# Patient Record
Sex: Female | Born: 1965 | Race: White | Hispanic: No | Smoking: Never smoker
Health system: Southern US, Community
[De-identification: ages and names within clinical notes are randomized; demographics above are authoritative.]

## PROBLEM LIST (undated history)

## (undated) DIAGNOSIS — E7800 Pure hypercholesterolemia, unspecified: Secondary | ICD-10-CM

## (undated) DIAGNOSIS — E119 Type 2 diabetes mellitus without complications: Secondary | ICD-10-CM

## (undated) DIAGNOSIS — I10 Essential (primary) hypertension: Secondary | ICD-10-CM

## (undated) DIAGNOSIS — N182 Chronic kidney disease, stage 2 (mild): Secondary | ICD-10-CM

## (undated) DIAGNOSIS — E559 Vitamin D deficiency, unspecified: Secondary | ICD-10-CM

## (undated) DIAGNOSIS — F419 Anxiety disorder, unspecified: Secondary | ICD-10-CM

## (undated) HISTORY — DX: Pure hypercholesterolemia, unspecified: E78.00

## (undated) HISTORY — DX: Anxiety disorder, unspecified: F41.9

## (undated) HISTORY — DX: Type 2 diabetes mellitus without complications: E11.9

## (undated) HISTORY — DX: Vitamin D deficiency, unspecified: E55.9

## (undated) HISTORY — DX: Chronic kidney disease, stage 2 (mild): N18.2

## (undated) HISTORY — DX: Essential (primary) hypertension: I10

---

## 2004-08-26 ENCOUNTER — Ambulatory Visit: Payer: Self-pay | Admitting: General Practice

## 2004-09-07 ENCOUNTER — Ambulatory Visit: Payer: Self-pay | Admitting: General Practice

## 2005-03-31 ENCOUNTER — Ambulatory Visit: Payer: Self-pay

## 2005-09-07 ENCOUNTER — Ambulatory Visit: Payer: Self-pay | Admitting: General Practice

## 2006-10-04 ENCOUNTER — Ambulatory Visit: Payer: Self-pay | Admitting: Endocrinology

## 2007-11-02 ENCOUNTER — Ambulatory Visit: Payer: Self-pay

## 2008-11-04 ENCOUNTER — Ambulatory Visit: Payer: Self-pay

## 2009-11-10 ENCOUNTER — Ambulatory Visit: Payer: Self-pay

## 2010-03-15 HISTORY — PX: BREAST BIOPSY: SHX20

## 2010-11-24 ENCOUNTER — Ambulatory Visit: Payer: Self-pay

## 2010-11-26 ENCOUNTER — Ambulatory Visit: Payer: Self-pay

## 2010-12-31 ENCOUNTER — Ambulatory Visit: Payer: Self-pay | Admitting: General Surgery

## 2011-02-08 ENCOUNTER — Ambulatory Visit: Payer: Self-pay | Admitting: General Surgery

## 2011-04-22 ENCOUNTER — Ambulatory Visit: Payer: Self-pay | Admitting: General Surgery

## 2013-03-15 HISTORY — PX: COLONOSCOPY: SHX174

## 2013-12-11 ENCOUNTER — Ambulatory Visit: Payer: Self-pay

## 2014-07-12 ENCOUNTER — Ambulatory Visit
Admit: 2014-07-12 | Disposition: A | Discharge status: Home / Self Care | Payer: Self-pay | Attending: Gastroenterology | Admitting: Gastroenterology

## 2015-12-11 ENCOUNTER — Other Ambulatory Visit: Payer: Self-pay | Admitting: Obstetrics and Gynecology

## 2015-12-11 DIAGNOSIS — Z1231 Encounter for screening mammogram for malignant neoplasm of breast: Secondary | ICD-10-CM

## 2015-12-18 ENCOUNTER — Encounter: Payer: Self-pay | Admitting: Radiology

## 2015-12-18 ENCOUNTER — Ambulatory Visit
Admission: RE | Admit: 2015-12-18 | Discharge: 2015-12-18 | Disposition: A | Discharge status: Home / Self Care | Payer: BLUE CROSS/BLUE SHIELD | Source: Ambulatory Visit | Attending: Obstetrics and Gynecology | Admitting: Obstetrics and Gynecology

## 2015-12-18 DIAGNOSIS — Z1231 Encounter for screening mammogram for malignant neoplasm of breast: Secondary | ICD-10-CM | POA: Diagnosis not present

## 2015-12-24 ENCOUNTER — Inpatient Hospital Stay
Admission: RE | Admit: 2015-12-24 | Discharge: 2015-12-24 | Disposition: A | Discharge status: Home / Self Care | Payer: Self-pay | Source: Ambulatory Visit | Attending: *Deleted | Admitting: *Deleted

## 2015-12-24 ENCOUNTER — Other Ambulatory Visit: Payer: Self-pay | Admitting: *Deleted

## 2015-12-24 DIAGNOSIS — Z9289 Personal history of other medical treatment: Secondary | ICD-10-CM

## 2016-06-29 ENCOUNTER — Other Ambulatory Visit: Payer: Self-pay

## 2016-06-29 MED ORDER — LISINOPRIL 10 MG PO TABS: 10.0000 mg | ORAL_TABLET | Freq: Every day | ORAL | 0 refills | Status: DC

## 2016-06-29 MED ORDER — CITALOPRAM HYDROBROMIDE 40 MG PO TABS: 40.0000 mg | ORAL_TABLET | Freq: Every day | ORAL | 0 refills | Status: DC

## 2016-06-29 MED ORDER — ATORVASTATIN CALCIUM 20 MG PO TABS: 20.0000 mg | ORAL_TABLET | Freq: Every day | ORAL | 0 refills | Status: DC

## 2016-07-09 ENCOUNTER — Encounter: Payer: Self-pay | Admitting: Advanced Practice Midwife

## 2016-07-09 ENCOUNTER — Ambulatory Visit (INDEPENDENT_AMBULATORY_CARE_PROVIDER_SITE_OTHER): Payer: BLUE CROSS/BLUE SHIELD | Admitting: Advanced Practice Midwife

## 2016-07-09 VITALS — BP 130/80 | HR 81 | Ht 65.0 in | Wt 225.0 lb

## 2016-07-09 DIAGNOSIS — Z01419 Encounter for gynecological examination (general) (routine) without abnormal findings: Secondary | ICD-10-CM

## 2016-07-09 NOTE — Progress Notes (Signed)
Patient ID: Cloria Spring, female   DOB: Dec 12, 1965, 51 y.o.   MRN: [id number]     Gynecology Annual Exam  PCP: Althea Grimmer, PA-C  Chief Complaint:  Chief Complaint  Patient presents with  . Gynecologic Exam    History of Present Illness:Patient is a 51 y.o. G2P1102 presents for annual exam. The patient has no complaints today.   LMP: No LMP recorded. Patient is not currently having periods (Reason: Postmenopausal).   The patient is sexually active. She denies dyspareunia.  The patient does perform self breast exams.  There is no notable family history of breast or ovarian cancer in her family. The patient is adopted and does not know that history.  The patient wears seatbelts: yes.   The patient has regular exercise: yes.    The patient denies current symptoms of depression.     Review of Systems: Review of Systems  Constitutional: Negative.   HENT: Negative.   Eyes: Negative.   Respiratory: Negative.   Cardiovascular: Negative.   Gastrointestinal: Negative.   Genitourinary: Negative.   Musculoskeletal: Negative.   Skin: Negative.   Neurological: Negative.   Endo/Heme/Allergies: Negative.   Psychiatric/Behavioral: Negative.     Past Medical History:  Past Medical History:  Diagnosis Date  . Diabetes mellitus without complication (HCC)   . Hypercholesteremia   . Hypertension     Past Surgical History:  Past Surgical History:  Procedure Laterality Date  . BREAST BIOPSY Right 2012   2 bx/clips-neg  . CESAREAN SECTION      Gynecologic History:  No LMP recorded. Patient is not currently having periods (Reason: Perimenopausal). Last Pap: Results were: no abnormalities 1 year ago Last mammogram: normal 6 months ago Obstetric History: [id number]  Family History:  Family History  Problem Relation Age of Onset  . Adopted: Yes    Social History:  Social History   Social History  . Marital status: Legally Separated    Spouse name: N/A  .  Number of children: N/A  . Years of education: N/A   Occupational History  . Not on file.   Social History Main Topics  . Smoking status: Never Smoker  . Smokeless tobacco: Never Used  . Alcohol use No  . Drug use: No  . Sexual activity: No   Other Topics Concern  . Not on file   Social History Narrative  . No narrative on file    Allergies:  No Known Allergies  Medications: Prior to Admission medications   Medication Sig Start Date End Date Taking? Authorizing Provider  atorvastatin (LIPITOR) 20 MG tablet Take 1 tablet (20 mg total) by mouth daily. 06/29/16 07/29/16 Yes Alicia B Copland, PA-C  citalopram (CELEXA) 40 MG tablet Take 1 tablet (40 mg total) by mouth daily. 06/29/16 07/29/16 Yes Alicia B Copland, PA-C  lisinopril (PRINIVIL,ZESTRIL) 10 MG tablet Take 1 tablet (10 mg total) by mouth daily. 06/29/16 07/29/16 Yes Alicia B Copland, PA-C  metFORMIN (GLUCOPHAGE) 500 MG tablet Take by mouth.   Yes Historical Provider, MD  polyethylene glycol powder (GLYCOLAX/MIRALAX) powder Take as directed for colonoscopy prep. 05/30/14   Historical Provider, MD    Physical Exam Vitals: Blood pressure 130/80, pulse 81, height  (1.651 m), weight 225 lb (102.1 kg).  General: NAD HEENT: normocephalic, anicteric Thyroid: no enlargement, no palpable nodules Pulmonary: No increased work of breathing, CTAB Cardiovascular: RRR, distal pulses 2+ Breast: Breast symmetrical, no tenderness, no palpable nodules or masses, no skin or nipple retraction present, no  nipple discharge.  No axillary or supraclavicular lymphadenopathy. Abdomen: NABS, soft, non-tender, non-distended.  Umbilicus without lesions.  No hepatomegaly, splenomegaly or masses palpable. No evidence of hernia  Genitourinary:  External: Normal external female genitalia.  Normal urethral meatus, normal  Bartholin's and Skene's glands.    Vagina: Normal vaginal mucosa, no evidence of prolapse.    Cervix: Grossly normal in appearance,  no bleeding, no CMT  Uterus: Non-enlarged, mobile, normal contour.    Adnexa: ovaries non-enlarged, no adnexal masses  Rectal: deferred  Lymphatic: no evidence of inguinal lymphadenopathy Extremities: no edema, erythema, or tenderness Neurologic: Grossly intact Psychiatric: mood appropriate, affect full    Assessment: 51 y.o. [id number] Well woman exam  Plan: Problem List Items Addressed This Visit    None    Visit Diagnoses    Well woman exam with routine gynecological exam    -  Primary      1) Mammogram - recommend yearly screening mammogram.  Mammogram Is up to date  2) Continue healthy lifestyle diet and exercise  3) ASCCP guidelines and rational discussed.  Patient opts for every 3 years screening interval  4) Osteoporosis  - per USPTF routine screening DEXA at age 82  5) Routine healthcare maintenance including cholesterol, diabetes screening discussed: declines blood work today  6) Colonoscopy: UTD 2 years ago normal  7) Rx's refilled through mail order [facility]  8) Follow up 1 year for routine annual   Tresea Mall, [place]

## 2016-07-12 ENCOUNTER — Telehealth: Payer: Self-pay

## 2016-07-12 NOTE — Telephone Encounter (Signed)
Pharmacy calling to verify rx's rcvd from [facility] for Metformin & Lisinopril. They just need to validate if this was for Kathy Burnett DOB [birth year]. Pharmacy states it's not on the rx. [phone]

## 2016-07-13 NOTE — Telephone Encounter (Signed)
Spoke w/pharmacy. Verified pt was seen by [facility] on 07/09/16 & per visit note, Rx's were sent to [facility] mail order. Pharmacy states they only identified pt by her insurance ID#. Pt Name & DOB was left off of pharmacy form.

## 2016-07-14 NOTE — Telephone Encounter (Signed)
Jane completed prescription  form for pt.

## 2016-11-01 ENCOUNTER — Telehealth: Payer: Self-pay

## 2016-11-01 NOTE — Telephone Encounter (Signed)
Pt found knot on breast bone between breasts.  Work Engineer, civil (consulting) adv her to be seen.  Adv I agreed.  Appt 11:10 in am c ABC.  Pt due for mammogram in Oct.

## 2016-11-02 ENCOUNTER — Ambulatory Visit (INDEPENDENT_AMBULATORY_CARE_PROVIDER_SITE_OTHER): Payer: BLUE CROSS/BLUE SHIELD | Admitting: Obstetrics and Gynecology

## 2016-11-02 VITALS — BP 120/74 | Ht 65.0 in | Wt 198.0 lb

## 2016-11-02 DIAGNOSIS — M899 Disorder of bone, unspecified: Secondary | ICD-10-CM

## 2016-11-02 DIAGNOSIS — M898X8 Other specified disorders of bone, other site: Secondary | ICD-10-CM

## 2016-11-02 NOTE — Progress Notes (Signed)
   Chief Complaint  Patient presents with  . Breast Mass    located at sternum    HPI:      Kathy Burnett is a 51 y.o. [id number] who LMP was No LMP recorded. Patient is perimenopausal., presents today for mass on sternum for the past 1 1/2 - 2 months. Pt just noticed it one day. No pain, non-tender, no change in size since first noticed. No breast masses. Last mammo 12/18/15 was neg. Pt is adopted so FH unknown.   Past Medical History:  Diagnosis Date  . Diabetes mellitus without complication (HCC)   . Hypercholesteremia   . Hypertension     Past Surgical History:  Procedure Laterality Date  . BREAST BIOPSY Right 2012   2 bx/clips-neg  . CESAREAN SECTION      Family History  Problem Relation Age of Onset  . Adopted: Yes    Social History   Social History  . Marital status: Legally Separated    Spouse name: N/A  . Number of children: N/A  . Years of education: N/A   Occupational History  . Not on file.   Social History Main Topics  . Smoking status: Never Smoker  . Smokeless tobacco: Never Used  . Alcohol use No  . Drug use: No  . Sexual activity: No   Other Topics Concern  . Not on file   Social History Narrative  . No narrative on file     Current Outpatient Prescriptions:  .  metFORMIN (GLUCOPHAGE) 500 MG tablet, Take by mouth., Disp: , Rfl:  .  atorvastatin (LIPITOR) 20 MG tablet, Take 1 tablet (20 mg total) by mouth daily., Disp: 30 tablet, Rfl: 0 .  citalopram (CELEXA) 40 MG tablet, Take 1 tablet (40 mg total) by mouth daily., Disp: 30 tablet, Rfl: 0 .  lisinopril (PRINIVIL,ZESTRIL) 10 MG tablet, Take 1 tablet (10 mg total) by mouth daily., Disp: 30 tablet, Rfl: 0 .  polyethylene glycol powder (GLYCOLAX/MIRALAX) powder, Take as directed for colonoscopy prep., Disp: , Rfl:    ROS:  Review of Systems  Constitutional: Negative for fatigue and fever.  Respiratory: Negative for cough, shortness of breath and wheezing.   Cardiovascular:  Negative for chest pain and palpitations.  Gastrointestinal: Negative for constipation, diarrhea and nausea.  Musculoskeletal: Negative for arthralgias, joint swelling and myalgias.     OBJECTIVE:   Vitals:  BP 120/74   Ht 5\' 5"  (1.651 m)   Wt 198 lb (89.8 kg)   BMI 32.95 kg/m   Physical Exam  Constitutional: She is well-developed, well-nourished, and in no distress.  Pulmonary/Chest: She exhibits mass and deformity. She exhibits no tenderness and no bony tenderness.    Vitals reviewed.    Assessment/Plan: Sternal mass - Chck u/s. Will f/u with results and go from there. - Plan: US Abdomen Limited  Spoke with Jennette Banker at [facility] imaging who suggested u/s first as opposed to xray.   Return if symptoms worsen or fail to improve.  Davyon Fisch B. Willman Cuny, PA-C 11/02/2016 11:35 AM

## 2016-11-05 ENCOUNTER — Ambulatory Visit
Admission: RE | Admit: 2016-11-05 | Discharge: 2016-11-05 | Disposition: A | Discharge status: Home / Self Care | Payer: BLUE CROSS/BLUE SHIELD | Source: Ambulatory Visit | Attending: Obstetrics and Gynecology | Admitting: Obstetrics and Gynecology

## 2016-11-05 DIAGNOSIS — M898X8 Other specified disorders of bone, other site: Secondary | ICD-10-CM

## 2016-11-05 DIAGNOSIS — M899 Disorder of bone, unspecified: Secondary | ICD-10-CM | POA: Diagnosis not present

## 2016-12-13 ENCOUNTER — Other Ambulatory Visit: Payer: Self-pay | Admitting: Obstetrics and Gynecology

## 2016-12-13 DIAGNOSIS — Z1231 Encounter for screening mammogram for malignant neoplasm of breast: Secondary | ICD-10-CM

## 2017-01-05 ENCOUNTER — Encounter: Payer: Self-pay | Admitting: Obstetrics and Gynecology

## 2017-01-05 ENCOUNTER — Encounter: Payer: Self-pay | Admitting: Radiology

## 2017-01-05 ENCOUNTER — Ambulatory Visit
Admission: RE | Admit: 2017-01-05 | Discharge: 2017-01-05 | Disposition: A | Discharge status: Home / Self Care | Payer: BLUE CROSS/BLUE SHIELD | Source: Ambulatory Visit | Attending: Obstetrics and Gynecology | Admitting: Obstetrics and Gynecology

## 2017-01-05 DIAGNOSIS — Z1231 Encounter for screening mammogram for malignant neoplasm of breast: Secondary | ICD-10-CM | POA: Diagnosis present

## 2017-07-11 ENCOUNTER — Ambulatory Visit (INDEPENDENT_AMBULATORY_CARE_PROVIDER_SITE_OTHER): Payer: BLUE CROSS/BLUE SHIELD | Admitting: Obstetrics and Gynecology

## 2017-07-11 ENCOUNTER — Encounter: Payer: Self-pay | Admitting: Obstetrics and Gynecology

## 2017-07-11 VITALS — BP 128/78 | HR 75 | Ht 65.0 in | Wt 165.0 lb

## 2017-07-11 DIAGNOSIS — E119 Type 2 diabetes mellitus without complications: Secondary | ICD-10-CM | POA: Insufficient documentation

## 2017-07-11 DIAGNOSIS — Z1321 Encounter for screening for nutritional disorder: Secondary | ICD-10-CM | POA: Diagnosis not present

## 2017-07-11 DIAGNOSIS — F419 Anxiety disorder, unspecified: Secondary | ICD-10-CM | POA: Diagnosis not present

## 2017-07-11 DIAGNOSIS — Z13220 Encounter for screening for lipoid disorders: Secondary | ICD-10-CM | POA: Diagnosis not present

## 2017-07-11 DIAGNOSIS — Z0000 Encounter for general adult medical examination without abnormal findings: Secondary | ICD-10-CM

## 2017-07-11 DIAGNOSIS — I10 Essential (primary) hypertension: Secondary | ICD-10-CM | POA: Diagnosis not present

## 2017-07-11 DIAGNOSIS — Z1231 Encounter for screening mammogram for malignant neoplasm of breast: Secondary | ICD-10-CM

## 2017-07-11 DIAGNOSIS — Z124 Encounter for screening for malignant neoplasm of cervix: Secondary | ICD-10-CM | POA: Diagnosis not present

## 2017-07-11 DIAGNOSIS — Z131 Encounter for screening for diabetes mellitus: Secondary | ICD-10-CM

## 2017-07-11 DIAGNOSIS — N182 Chronic kidney disease, stage 2 (mild): Secondary | ICD-10-CM | POA: Insufficient documentation

## 2017-07-11 DIAGNOSIS — Z01419 Encounter for gynecological examination (general) (routine) without abnormal findings: Secondary | ICD-10-CM

## 2017-07-11 DIAGNOSIS — Z1239 Encounter for other screening for malignant neoplasm of breast: Secondary | ICD-10-CM

## 2017-07-11 DIAGNOSIS — Z1151 Encounter for screening for human papillomavirus (HPV): Secondary | ICD-10-CM | POA: Diagnosis not present

## 2017-07-11 DIAGNOSIS — E7800 Pure hypercholesterolemia, unspecified: Secondary | ICD-10-CM | POA: Diagnosis not present

## 2017-07-11 MED ORDER — CITALOPRAM HYDROBROMIDE 40 MG PO TABS: 40.0000 mg | ORAL_TABLET | Freq: Every day | ORAL | 3 refills | Status: DC

## 2017-07-11 NOTE — Patient Instructions (Signed)
I value your feedback and entrusting us with your care. If you get a [facility] patient survey, I would appreciate you taking the time to let us know about your experience today. Thank you! 

## 2017-07-11 NOTE — Progress Notes (Signed)
PCP: Rica Records, PA-C   Chief Complaint  Patient presents with  . Gynecologic Exam    HPI:      [name] is a 52 y.o. [id number] who LMP was No LMP recorded. Patient is postmenopausal., presents today for her annual examination.  Her menses are absent for over a year due to menopause.  She does not have intermenstrual bleeding. She does not have vasomotor sx.   Sex activity: single partner, contraception - post menopausal status. She does not have vaginal dryness.  Last Pap: May 02, 2014  Results were: no abnormalities /neg HPV DNA.  Hx of STDs: none  Last mammogram: January 05, 2017  Results were: normal--routine follow-up in 12 months Pt is adopted, FH unknown. The patient does do self-breast exams. No change in enlarged xiphoid process since 8/18 u/s; most likely more prominent since wt loss.  Colonoscopy: colonoscopy 3 years ago without abnormalities.  Repeat due after 10 years.   Tobacco use: The patient denies current or previous tobacco use. Alcohol use: none Exercise: moderately active  She does get adequate calcium but not Vitamin D in her diet.  Pt takes celexa 40 mg daily for anxiety with sx control. No side effects. Wants to cont meds.   Pt with hx of hyperlipidemia, type 2 DM, stage 2 kidney disease, and HTN and is on lipitor 20 mg, metformin 500 mg, and lisinopril 10 mg. Pt didn't have labs last yr. Has lost 70# this yr with keto diet/exercise changes. She has not had recent labs since wt loss. Due today.   Past Medical History:  Diagnosis Date  . Anxiety   . Diabetes mellitus without complication (HCC)   . Hypercholesteremia   . Hypertension   . Stage 2 chronic kidney disease   . Vitamin D deficiency     Past Surgical History:  Procedure Laterality Date  . BREAST BIOPSY Right 2012   2 bx/clips-neg  . CESAREAN SECTION    . COLONOSCOPY  2015    Family History  Adopted: Yes    Social History   Socioeconomic History    . Marital status: Legally Separated    Spouse name: Not on file  . Number of children: Not on file  . Years of education: Not on file  . Highest education level: Not on file  Occupational History  . Not on file  Social Needs  . Financial resource strain: Not on file  . Food insecurity:    Worry: Not on file    Inability: Not on file  . Transportation needs:    Medical: Not on file    Non-medical: Not on file  Tobacco Use  . Smoking status: Never Smoker  . Smokeless tobacco: Never Used  Substance and Sexual Activity  . Alcohol use: No  . Drug use: No  . Sexual activity: Yes    Birth control/protection: Post-menopausal  Lifestyle  . Physical activity:    Days per week: Not on file    Minutes per session: Not on file  . Stress: Not on file  Relationships  . Social connections:    Talks on phone: Not on file    Gets together: Not on file    Attends religious service: Not on file    Active member of club or organization: Not on file    Attends meetings of clubs or organizations: Not on file    Relationship status: Not on file  . Intimate partner violence:  Fear of current or ex partner: Not on file    Emotionally abused: Not on file    Physically abused: Not on file    Forced sexual activity: Not on file  Other Topics Concern  . Not on file  Social History Narrative  . Not on file    Outpatient Medications Prior to Visit  Medication Sig Dispense Refill  . metFORMIN (GLUCOPHAGE) 500 MG tablet Take by mouth.    Marland Kitchen atorvastatin (LIPITOR) 20 MG tablet Take 1 tablet (20 mg total) by mouth daily. 30 tablet 0  . lisinopril (PRINIVIL,ZESTRIL) 10 MG tablet Take 1 tablet (10 mg total) by mouth daily. 30 tablet 0  . polyethylene glycol powder (GLYCOLAX/MIRALAX) powder Take as directed for colonoscopy prep.    . citalopram (CELEXA) 40 MG tablet Take 1 tablet (40 mg total) by mouth daily. 30 tablet 0   No facility-administered medications prior to visit.         ROS:  Review of Systems  Constitutional: Negative for fatigue, fever and unexpected weight change.  Respiratory: Negative for cough, shortness of breath and wheezing.   Cardiovascular: Negative for chest pain, palpitations and leg swelling.  Gastrointestinal: Negative for blood in stool, constipation, diarrhea, nausea and vomiting.  Endocrine: Negative for cold intolerance, heat intolerance and polyuria.  Genitourinary: Negative for dyspareunia, dysuria, flank pain, frequency, genital sores, hematuria, menstrual problem, pelvic pain, urgency, vaginal bleeding, vaginal discharge and vaginal pain.  Musculoskeletal: Negative for back pain, joint swelling and myalgias.  Skin: Negative for rash.  Neurological: Negative for dizziness, syncope, light-headedness, numbness and headaches.  Hematological: Negative for adenopathy.  Psychiatric/Behavioral: Negative for agitation, confusion, sleep disturbance and suicidal ideas. The patient is not nervous/anxious.    BREAST: No symptoms    Objective: BP 128/78   Pulse 75   Ht  (1.651 m)   Wt 165 lb (74.8 kg)   BMI 27.46 kg/m    Physical Exam  Constitutional: She is oriented to person, place, and time. She appears well-developed and well-nourished.  Genitourinary: Vagina normal and uterus normal. There is no rash or tenderness on the right labia. There is no rash or tenderness on the left labia. No erythema or tenderness in the vagina. No vaginal discharge found. Right adnexum does not display mass and does not display tenderness. Left adnexum does not display mass and does not display tenderness. Cervix does not exhibit motion tenderness or polyp. Uterus is not enlarged or tender.  Neck: Normal range of motion. No thyromegaly present.  Cardiovascular: Normal rate, regular rhythm and normal heart sounds.  No murmur heard. Pulmonary/Chest: Effort normal and breath sounds normal. Right breast exhibits no mass, no nipple discharge,  no skin change and no tenderness. Left breast exhibits no mass, no nipple discharge, no skin change and no tenderness.  Abdominal: Soft. There is no tenderness. There is no guarding.  Musculoskeletal: Normal range of motion.  Neurological: She is alert and oriented to person, place, and time. No cranial nerve deficit.  Psychiatric: She has a normal mood and affect. Her behavior is normal.  Vitals reviewed.   Assessment/Plan:  Encounter for annual routine gynecological examination  Cervical cancer screening - Plan: IGP, Aptima HPV  Screening for HPV (human papillomavirus) - Plan: IGP, Aptima HPV  Screening for breast cancer - Pt to sched mammo 10/19. - Plan: MM 3D SCREEN BREAST BILATERAL  Anxiety - Rx RF citalopram. F/u prn.  - Plan: citalopram (CELEXA) 40 MG tablet  Blood tests for routine general  physical examination - Plan: Comprehensive metabolic panel, Lipid panel, Hemoglobin A1c, VITAMIN D 25 Hydroxy (Vit-D Deficiency, Fractures)  Screening cholesterol level - Plan: Lipid panel  Encounter for vitamin deficiency screening - Plan: VITAMIN D 25 Hydroxy (Vit-D Deficiency, Fractures)  Screening for diabetes mellitus - Plan: Hemoglobin A1c  Stage 2 chronic kidney disease  Essential hypertension  Hypercholesteremia  Diabetes mellitus without complication (HCC)  Will f/u with lab results and med mgmt based on levels.   Meds ordered this encounter  Medications  . citalopram (CELEXA) 40 MG tablet    Sig: Take 1 tablet (40 mg total) by mouth daily.    Dispense:  90 tablet    Refill:  3    Order Specific Question:   Supervising Provider    Answer:   Nadara Mustard [[id number]]           GYN counsel breast self exam, mammography screening, menopause, adequate intake of calcium and vitamin D, diet and exercise    F/U  Return in about 1 year (around 07/12/2018).  Alicia B. Copland, PA-C 07/11/2017 8:43 AM

## 2017-07-12 ENCOUNTER — Telehealth: Payer: Self-pay | Admitting: Obstetrics and Gynecology

## 2017-07-12 DIAGNOSIS — Z13220 Encounter for screening for lipoid disorders: Secondary | ICD-10-CM

## 2017-07-12 DIAGNOSIS — Z131 Encounter for screening for diabetes mellitus: Secondary | ICD-10-CM

## 2017-07-12 DIAGNOSIS — Z0000 Encounter for general adult medical examination without abnormal findings: Secondary | ICD-10-CM

## 2017-07-12 LAB — COMPREHENSIVE METABOLIC PANEL
ALT: 22 IU/L (ref 0–32)
AST: 20 IU/L (ref 0–40)
Albumin/Globulin Ratio: 1.6 (ref 1.2–2.2)
Albumin: 4.4 g/dL (ref 3.5–5.5)
Alkaline Phosphatase: 98 IU/L (ref 39–117)
BUN/Creatinine Ratio: 24 — ABNORMAL HIGH (ref 9–23)
BUN: 22 mg/dL (ref 6–24)
Bilirubin Total: 0.3 mg/dL (ref 0.0–1.2)
CALCIUM: 10 mg/dL (ref 8.7–10.2)
CO2: 23 mmol/L (ref 20–29)
Chloride: 99 mmol/L (ref 96–106)
Creatinine, Ser: 0.92 mg/dL (ref 0.57–1.00)
GFR, EST AFRICAN AMERICAN: 82 mL/min/{1.73_m2} (ref >59)
GFR, EST NON AFRICAN AMERICAN: 71 mL/min/{1.73_m2} (ref >59)
GLOBULIN, TOTAL: 2.7 g/dL (ref 1.5–4.5)
Glucose: 92 mg/dL (ref 65–99)
POTASSIUM: 4.3 mmol/L (ref 3.5–5.2)
Sodium: 139 mmol/L (ref 134–144)
TOTAL PROTEIN: 7.1 g/dL (ref 6.0–8.5)

## 2017-07-12 LAB — LIPID PANEL
CHOL/HDL RATIO: 3.6 ratio (ref 0.0–4.4)
Cholesterol, Total: 250 mg/dL — ABNORMAL HIGH (ref 100–199)
HDL: 69 mg/dL (ref >39)
LDL Calculated: 156 mg/dL — ABNORMAL HIGH (ref 0–99)
Triglycerides: 123 mg/dL (ref 0–149)
VLDL Cholesterol Cal: 25 mg/dL (ref 5–40)

## 2017-07-12 LAB — VITAMIN D 25 HYDROXY (VIT D DEFICIENCY, FRACTURES): Vit D, 25-Hydroxy: 66.3 ng/mL (ref 30.0–100.0)

## 2017-07-12 LAB — HEMOGLOBIN A1C
Est. average glucose Bld gHb Est-mCnc: 108 mg/dL
HEMOGLOBIN A1C: 5.4 % (ref 4.8–5.6)

## 2017-07-12 MED ORDER — ATORVASTATIN CALCIUM 20 MG PO TABS: 20.0000 mg | ORAL_TABLET | Freq: Every day | ORAL | 0 refills | Status: DC

## 2017-07-12 MED ORDER — LISINOPRIL 10 MG PO TABS: 10.0000 mg | ORAL_TABLET | Freq: Every day | ORAL | 0 refills | Status: DC

## 2017-07-12 NOTE — Telephone Encounter (Signed)
Pt aware of labs. Kidneys look good, on lisinopril for stage 2 CKD. Cont lisinopril.  HgA1C WNL on metformin 500 mg daily. Stop metformin. Rechk labs in 3 months. Has lost significant wt.  Lipids increased from WNL on labs 3/17. Pt doing keto diet so eating lots of saturated fats and eggs. On lipitor 20 mg. Diet changes for healthier keto diet. Rechk in 3 months. Cont 20 mg dose.  Pt to do labs through work. Orders mailed to pt.

## 2017-07-14 LAB — IGP, APTIMA HPV
HPV APTIMA: NEGATIVE
PAP Smear Comment: 0

## 2017-10-02 ENCOUNTER — Other Ambulatory Visit: Payer: Self-pay | Admitting: Obstetrics and Gynecology

## 2017-10-21 ENCOUNTER — Other Ambulatory Visit: Payer: Self-pay | Admitting: Obstetrics and Gynecology

## 2017-10-21 NOTE — Telephone Encounter (Signed)
Pls let pt know labs are due. She usually does through work. I have orders in system for what she needs. Thx.

## 2017-10-21 NOTE — Telephone Encounter (Signed)
Please advise 

## 2017-10-24 ENCOUNTER — Other Ambulatory Visit: Payer: Self-pay | Admitting: Obstetrics and Gynecology

## 2017-10-24 ENCOUNTER — Telehealth: Payer: Self-pay

## 2017-10-24 NOTE — Telephone Encounter (Signed)
Pt called and stated had labs drawn at work this morning, however she has no way to fax the results in and would like to know if it was possible to refill her medication until she get the lab work back, the nurse she works with is only in on Monday. She only has 5 more pills left. Please advise, thank you

## 2017-10-24 NOTE — Telephone Encounter (Signed)
Left vm for pt to give us a call back and let us know if she wants to come here for labs or through work.

## 2017-10-25 ENCOUNTER — Other Ambulatory Visit: Payer: Self-pay | Admitting: Obstetrics and Gynecology

## 2017-10-25 MED ORDER — ATORVASTATIN CALCIUM 20 MG PO TABS: 20.0000 mg | ORAL_TABLET | Freq: Every day | ORAL | 0 refills | Status: DC

## 2017-10-25 NOTE — Telephone Encounter (Signed)
Rx RF eRxd 10/24/17 and again 10/25/17 due to prescribing error.

## 2017-11-07 ENCOUNTER — Other Ambulatory Visit: Payer: Self-pay | Admitting: Obstetrics and Gynecology

## 2017-11-07 MED ORDER — ATORVASTATIN CALCIUM 20 MG PO TABS: 20.0000 mg | ORAL_TABLET | Freq: Every day | ORAL | 2 refills | Status: DC

## 2017-11-07 MED ORDER — LISINOPRIL 10 MG PO TABS
10.0000 mg | ORAL_TABLET | Freq: Every day | ORAL | 2 refills | Status: DC
Start: 2017-11-07 — End: 2018-02-22

## 2017-11-07 NOTE — Telephone Encounter (Signed)
Pt aware of normal HgA1C (5.3%) and improved lipids with lipitor 20 mg. Will cont Rx and rechk at 4/20 annual. No need for metformin. Cont lisinopril for stage 2 CKD.

## 2018-01-09 ENCOUNTER — Ambulatory Visit
Admission: RE | Admit: 2018-01-09 | Discharge: 2018-01-09 | Disposition: A | Discharge status: Home / Self Care | Payer: BLUE CROSS/BLUE SHIELD | Source: Ambulatory Visit | Attending: Obstetrics and Gynecology | Admitting: Obstetrics and Gynecology

## 2018-01-09 ENCOUNTER — Other Ambulatory Visit: Payer: Self-pay | Admitting: Obstetrics and Gynecology

## 2018-01-09 DIAGNOSIS — R928 Other abnormal and inconclusive findings on diagnostic imaging of breast: Secondary | ICD-10-CM

## 2018-01-09 DIAGNOSIS — Z1239 Encounter for other screening for malignant neoplasm of breast: Secondary | ICD-10-CM | POA: Diagnosis present

## 2018-01-09 DIAGNOSIS — N6489 Other specified disorders of breast: Secondary | ICD-10-CM

## 2018-01-18 ENCOUNTER — Ambulatory Visit
Admission: RE | Admit: 2018-01-18 | Discharge: 2018-01-18 | Disposition: A | Discharge status: Home / Self Care | Payer: BLUE CROSS/BLUE SHIELD | Source: Ambulatory Visit | Attending: Obstetrics and Gynecology | Admitting: Obstetrics and Gynecology

## 2018-01-18 ENCOUNTER — Encounter: Payer: Self-pay | Admitting: Obstetrics and Gynecology

## 2018-01-18 DIAGNOSIS — N6489 Other specified disorders of breast: Secondary | ICD-10-CM | POA: Diagnosis present

## 2018-01-18 DIAGNOSIS — R928 Other abnormal and inconclusive findings on diagnostic imaging of breast: Secondary | ICD-10-CM | POA: Diagnosis not present

## 2018-02-22 ENCOUNTER — Encounter: Payer: Self-pay | Admitting: Obstetrics and Gynecology

## 2018-02-22 ENCOUNTER — Other Ambulatory Visit: Payer: Self-pay | Admitting: Obstetrics and Gynecology

## 2018-02-22 MED ORDER — LISINOPRIL 10 MG PO TABS: 10.0000 mg | ORAL_TABLET | Freq: Every day | ORAL | 1 refills | Status: DC

## 2018-02-22 MED ORDER — ATORVASTATIN CALCIUM 20 MG PO TABS: 20.0000 mg | ORAL_TABLET | Freq: Every day | ORAL | 1 refills | Status: DC

## 2018-02-22 NOTE — Progress Notes (Signed)
Rx RF lisinopril and lipitor until 4/20 annual/labs. Pharm didn't have RF rom 8/19.

## 2018-06-24 ENCOUNTER — Other Ambulatory Visit: Payer: Self-pay | Admitting: Obstetrics and Gynecology

## 2018-06-24 DIAGNOSIS — F419 Anxiety disorder, unspecified: Secondary | ICD-10-CM

## 2018-07-13 ENCOUNTER — Ambulatory Visit: Payer: BLUE CROSS/BLUE SHIELD | Admitting: Obstetrics and Gynecology

## 2018-08-07 ENCOUNTER — Other Ambulatory Visit: Payer: Self-pay | Admitting: Obstetrics and Gynecology

## 2018-08-08 NOTE — Telephone Encounter (Signed)
Please advise 

## 2018-08-22 ENCOUNTER — Ambulatory Visit: Payer: BLUE CROSS/BLUE SHIELD | Admitting: Obstetrics and Gynecology

## 2018-09-15 ENCOUNTER — Other Ambulatory Visit: Payer: Self-pay | Admitting: Obstetrics and Gynecology

## 2018-09-15 DIAGNOSIS — F419 Anxiety disorder, unspecified: Secondary | ICD-10-CM

## 2018-10-04 ENCOUNTER — Ambulatory Visit: Payer: BLUE CROSS/BLUE SHIELD | Admitting: Obstetrics and Gynecology

## 2018-10-04 NOTE — Progress Notes (Signed)
PCP: [place]opland, Alicia B, PA-C   Chief Complaint  Patient presents with  . Gynecologic Exam    HPI:      [name] is a 53 y.o. [id number] who LMP was No LMP recorded. Patient is postmenopausal., presents today for her annual examination.  Her menses are absent due to menopause.  She does not have intermenstrual bleeding. She does not have vasomotor sx.   Sex activity: not sex active. She does not have vaginal dryness.  Last Pap: 07/11/17  Results were: no abnormalities /neg HPV DNA.  Hx of STDs: none  Last mammogram: 01/18/18  Results were: normal--routine follow-up in 12 months Pt is adopted, FH unknown. The patient does do self-breast exams. No change in enlarged xiphoid process since 8/18 u/s; most likely more prominent since wt loss.  Colonoscopy: colonoscopy 4 years ago without abnormalities.  Repeat due after 10 years.   Tobacco use: The patient denies current or previous tobacco use. Alcohol use: none Exercise: moderately active  She does get adequate calcium but not Vitamin D in her diet. Not taking Vit D supp.  Pt takes celexa 40 mg daily for anxiety with sx control. No side effects. Wants to cont meds.   Pt with hx of hyperlipidemia, type 2 DM, stage 2 kidney disease, and HTN and is on lipitor 20 mg and lisinopril 10 mg. Lost 70# last yr with keto diet/exercise changes and DM resolved, but has gained some wt back this yr. Pt was able to stop metformin last yr. Labs due today.  Past Medical History:  Diagnosis Date  . Anxiety   . Diabetes mellitus without complication (HCC)   . Hypercholesteremia   . Hypertension   . Stage 2 chronic kidney disease   . Vitamin D deficiency     Past Surgical History:  Procedure Laterality Date  . BREAST BIOPSY Right 2012   2 bx/clips-neg  . CESAREAN SECTION    . COLONOSCOPY  2015    Family History  Adopted: Yes    Social History   Socioeconomic History  . Marital status: Legally Separated    Spouse name:  Not on file  . Number of children: Not on file  . Years of education: Not on file  . Highest education level: Not on file  Occupational History  . Not on file  Social Needs  . Financial resource strain: Not on file  . Food insecurity    Worry: Not on file    Inability: Not on file  . Transportation needs    Medical: Not on file    Non-medical: Not on file  Tobacco Use  . Smoking status: Never Smoker  . Smokeless tobacco: Never Used  Substance and Sexual Activity  . Alcohol use: No  . Drug use: No  . Sexual activity: Not Currently    Birth control/protection: Post-menopausal  Lifestyle  . Physical activity    Days per week: Not on file    Minutes per session: Not on file  . Stress: Not on file  Relationships  . Social Musicianconnections    Talks on phone: Not on file    Gets together: Not on file    Attends religious service: Not on file    Active member of club or organization: Not on file    Attends meetings of clubs or organizations: Not on file    Relationship status: Not on file  . Intimate partner violence    Fear of current or ex partner: Not  on file    Emotionally abused: Not on file    Physically abused: Not on file    Forced sexual activity: Not on file  Other Topics Concern  . Not on file  Social History Narrative  . Not on file    Outpatient Medications Prior to Visit  Medication Sig Dispense Refill  . atorvastatin (LIPITOR) 20 MG tablet TAKE 1 TABLET(20 MG) BY MOUTH DAILY 90 tablet 0  . lisinopril (ZESTRIL) 10 MG tablet TAKE 1 TABLET(10 MG) BY MOUTH DAILY 90 tablet 1  . citalopram (CELEXA) 40 MG tablet TAKE 1 TABLET BY MOUTH EVERY DAY GENERIC EQUIVALENT FOR CELEXA 90 tablet 0  . metFORMIN (GLUCOPHAGE) 500 MG tablet Take by mouth.    . polyethylene glycol powder (GLYCOLAX/MIRALAX) powder Take as directed for colonoscopy prep.     No facility-administered medications prior to visit.        ROS:  Review of Systems  Constitutional: Negative for fatigue,  fever and unexpected weight change.  Respiratory: Negative for cough, shortness of breath and wheezing.   Cardiovascular: Negative for chest pain, palpitations and leg swelling.  Gastrointestinal: Negative for blood in stool, constipation, diarrhea, nausea and vomiting.  Endocrine: Negative for cold intolerance, heat intolerance and polyuria.  Genitourinary: Negative for dyspareunia, dysuria, flank pain, frequency, genital sores, hematuria, menstrual problem, pelvic pain, urgency, vaginal bleeding, vaginal discharge and vaginal pain.  Musculoskeletal: Negative for back pain, joint swelling and myalgias.  Skin: Negative for rash.  Neurological: Negative for dizziness, syncope, light-headedness, numbness and headaches.  Hematological: Negative for adenopathy.  Psychiatric/Behavioral: Negative for agitation, confusion, sleep disturbance and suicidal ideas. The patient is not nervous/anxious.   BREAST: No symptoms    Objective: BP 104/74   Ht 5\' 4"  (1.626 m)   Wt 183 lb 3.2 oz (83.1 kg)   BMI 31.45 kg/m    Physical Exam Constitutional:      Appearance: She is well-developed.  Genitourinary:     Vulva, vagina, uterus, right adnexa and left adnexa normal.     No vulval lesion or tenderness noted.     No vaginal discharge, erythema or tenderness.     No cervical motion tenderness or polyp.     Uterus is not enlarged or tender.     No right or left adnexal mass present.     Right adnexa not tender.     Left adnexa not tender.  Neck:     Musculoskeletal: Normal range of motion.     Thyroid: No thyromegaly.  Cardiovascular:     Rate and Rhythm: Normal rate and regular rhythm.     Heart sounds: Normal heart sounds. No murmur.  Pulmonary:     Effort: Pulmonary effort is normal.     Breath sounds: Normal breath sounds.  Chest:     Breasts:        Right: No mass, nipple discharge, skin change or tenderness.        Left: No mass, nipple discharge, skin change or tenderness.   Abdominal:     Palpations: Abdomen is soft.     Tenderness: There is no abdominal tenderness. There is no guarding.  Musculoskeletal: Normal range of motion.  Neurological:     General: No focal deficit present.     Mental Status: She is alert and oriented to person, place, and time.     Cranial Nerves: No cranial nerve deficit.  Skin:    General: Skin is warm and dry.  Psychiatric:  Mood and Affect: Mood normal.        Behavior: Behavior normal.        Thought Content: Thought content normal.        Judgment: Judgment normal.  Vitals signs reviewed.     Assessment/Plan:  Encounter for annual routine gynecological examination -   Screening for breast cancer - Plan: MM 3D SCREEN BREAST BILATERAL, Pt to sched mammo  Blood tests for routine general physical examination - Plan: Comprehensive metabolic panel, Lipid panel, Hemoglobin A1c,   Screening for diabetes mellitus - Plan: Hemoglobin A1c,   Stage 2 chronic kidney disease - Plan: Comprehensive metabolic panel,   Hypercholesteremia - Plan: Lipid panel, Hemoglobin A1c,   Essential hypertension - Plan: BP well controlled with lisinopril.  Anxiety - Rx RF citalopram. F/u prn.  - Plan: citalopram (CELEXA) 40 MG tablet,   Will f/u with lab results and med mgmt based on levels.   Meds ordered this encounter  Medications  . citalopram (CELEXA) 40 MG tablet    Sig: TAKE 1 TABLET BY MOUTH EVERY DAY    Dispense:  90 tablet    Refill:  3    Order Specific Question:   Supervising Provider    Answer:   Nadara MustardHARRIS, ROBERT P [[id number]]           GYN counsel breast self exam, mammography screening, menopause, adequate intake of calcium and vitamin D, diet and exercise    F/U  Return in about 1 year (around 10/05/2019).  Alicia B. Copland, PA-C 10/05/2018 9:17 AM

## 2018-10-05 ENCOUNTER — Other Ambulatory Visit: Payer: Self-pay

## 2018-10-05 ENCOUNTER — Ambulatory Visit (INDEPENDENT_AMBULATORY_CARE_PROVIDER_SITE_OTHER): Payer: BC Managed Care – PPO | Admitting: Obstetrics and Gynecology

## 2018-10-05 ENCOUNTER — Encounter: Payer: Self-pay | Admitting: Obstetrics and Gynecology

## 2018-10-05 VITALS — BP 104/74 | Ht 64.0 in | Wt 183.2 lb

## 2018-10-05 DIAGNOSIS — Z01419 Encounter for gynecological examination (general) (routine) without abnormal findings: Secondary | ICD-10-CM

## 2018-10-05 DIAGNOSIS — E7800 Pure hypercholesterolemia, unspecified: Secondary | ICD-10-CM

## 2018-10-05 DIAGNOSIS — Z0000 Encounter for general adult medical examination without abnormal findings: Secondary | ICD-10-CM

## 2018-10-05 DIAGNOSIS — I10 Essential (primary) hypertension: Secondary | ICD-10-CM

## 2018-10-05 DIAGNOSIS — Z1239 Encounter for other screening for malignant neoplasm of breast: Secondary | ICD-10-CM

## 2018-10-05 DIAGNOSIS — Z131 Encounter for screening for diabetes mellitus: Secondary | ICD-10-CM

## 2018-10-05 DIAGNOSIS — F419 Anxiety disorder, unspecified: Secondary | ICD-10-CM

## 2018-10-05 DIAGNOSIS — N182 Chronic kidney disease, stage 2 (mild): Secondary | ICD-10-CM

## 2018-10-05 MED ORDER — CITALOPRAM HYDROBROMIDE 40 MG PO TABS: ORAL_TABLET | ORAL | 3 refills | Status: DC

## 2018-10-05 NOTE — Patient Instructions (Signed)
I value your feedback and entrusting us with your care. If you get a [facility] patient survey, I would appreciate you taking the time to let us know about your experience today. Thank you! 

## 2018-10-06 ENCOUNTER — Other Ambulatory Visit: Payer: Self-pay | Admitting: Obstetrics and Gynecology

## 2018-10-06 LAB — COMPREHENSIVE METABOLIC PANEL
ALT: 29 IU/L (ref 0–32)
AST: 24 IU/L (ref 0–40)
Albumin/Globulin Ratio: 1.6 (ref 1.2–2.2)
Albumin: 4.5 g/dL (ref 3.8–4.9)
Alkaline Phosphatase: 97 IU/L (ref 39–117)
BUN/Creatinine Ratio: 19 (ref 9–23)
BUN: 17 mg/dL (ref 6–24)
Bilirubin Total: 0.3 mg/dL (ref 0.0–1.2)
CO2: 24 mmol/L (ref 20–29)
Calcium: 10.3 mg/dL — ABNORMAL HIGH (ref 8.7–10.2)
Chloride: 99 mmol/L (ref 96–106)
Creatinine, Ser: 0.88 mg/dL (ref 0.57–1.00)
GFR calc Af Amer: 86 mL/min/{1.73_m2} (ref >59)
GFR calc non Af Amer: 74 mL/min/{1.73_m2} (ref >59)
Globulin, Total: 2.8 g/dL (ref 1.5–4.5)
Glucose: 103 mg/dL — ABNORMAL HIGH (ref 65–99)
Potassium: 4.1 mmol/L (ref 3.5–5.2)
Sodium: 140 mmol/L (ref 134–144)
Total Protein: 7.3 g/dL (ref 6.0–8.5)

## 2018-10-06 LAB — LIPID PANEL
Chol/HDL Ratio: 3.2 ratio (ref 0.0–4.4)
Cholesterol, Total: 193 mg/dL (ref 100–199)
HDL: 60 mg/dL (ref >39)
LDL Calculated: 112 mg/dL — ABNORMAL HIGH (ref 0–99)
Triglycerides: 104 mg/dL (ref 0–149)
VLDL Cholesterol Cal: 21 mg/dL (ref 5–40)

## 2018-10-06 LAB — HEMOGLOBIN A1C
Est. average glucose Bld gHb Est-mCnc: 114 mg/dL
Hgb A1c MFr Bld: 5.6 % (ref 4.8–5.6)

## 2018-10-06 MED ORDER — LISINOPRIL 10 MG PO TABS: ORAL_TABLET | ORAL | 3 refills | Status: DC

## 2018-10-06 MED ORDER — ATORVASTATIN CALCIUM 20 MG PO TABS: ORAL_TABLET | ORAL | 3 refills | Status: DC

## 2018-10-06 NOTE — Progress Notes (Signed)
Rx RF lisinopril and lipitor. Rechk labs 1 yr.

## 2018-11-01 ENCOUNTER — Other Ambulatory Visit: Payer: Self-pay | Admitting: Obstetrics and Gynecology

## 2018-12-10 ENCOUNTER — Other Ambulatory Visit: Payer: Self-pay | Admitting: Obstetrics and Gynecology

## 2018-12-10 DIAGNOSIS — F419 Anxiety disorder, unspecified: Secondary | ICD-10-CM

## 2019-01-19 ENCOUNTER — Ambulatory Visit
Admission: RE | Admit: 2019-01-19 | Discharge: 2019-01-19 | Disposition: A | Discharge status: Home / Self Care | Payer: BC Managed Care – PPO | Source: Ambulatory Visit | Attending: Obstetrics and Gynecology | Admitting: Obstetrics and Gynecology

## 2019-01-19 ENCOUNTER — Encounter: Payer: Self-pay | Admitting: Obstetrics and Gynecology

## 2019-01-19 DIAGNOSIS — Z1231 Encounter for screening mammogram for malignant neoplasm of breast: Secondary | ICD-10-CM | POA: Insufficient documentation

## 2019-01-19 DIAGNOSIS — Z1239 Encounter for other screening for malignant neoplasm of breast: Secondary | ICD-10-CM

## 2019-08-21 ENCOUNTER — Other Ambulatory Visit: Payer: Self-pay | Admitting: Obstetrics and Gynecology

## 2019-08-21 DIAGNOSIS — F419 Anxiety disorder, unspecified: Secondary | ICD-10-CM

## 2019-10-18 ENCOUNTER — Other Ambulatory Visit: Payer: Self-pay | Admitting: Obstetrics and Gynecology

## 2019-10-18 NOTE — Telephone Encounter (Signed)
Annual 8/25, please advise.

## 2019-10-22 ENCOUNTER — Ambulatory Visit: Payer: BC Managed Care – PPO | Admitting: Obstetrics and Gynecology

## 2019-11-06 NOTE — Progress Notes (Signed)
PCP: Rica Records, PA-C   Chief Complaint  Patient presents with  . Gynecologic Exam    HPI:      [name] is a 54 y.o. [id number] who LMP was No LMP recorded. Patient is postmenopausal., presents today for her annual examination.  Her menses are absent due to menopause.  She does not have intermenstrual bleeding. She does not have vasomotor sx.   Sex activity: not sex active. She does not have vaginal dryness.  Last Pap: 07/11/17  Results were: no abnormalities /neg HPV DNA.  Hx of STDs: none  Last mammogram: 01/19/19  Results were: normal--routine follow-up in 12 months Pt is adopted, FH unknown. The patient does do self-breast exams.   Colonoscopy: colonoscopy 2015 without abnormalities.  Repeat due after 10 years.   Tobacco use: The patient denies current or previous tobacco use. Alcohol use: none  No drug use Exercise: moderately active  She does get adequate calcium but not Vitamin D in her diet. Not taking Vit D supp.  Pt takes celexa 40 mg daily for anxiety with sx control. No side effects. Wants to cont meds.   Pt with hx of hyperlipidemia, type 2 DM, stage 2 kidney disease, and HTN and is on lipitor 20 mg and lisinopril 10 mg. Lost 70# 2 yrs ago with keto diet/exercise changes and DM resolved, but had gained some wt back last yr. Pt was able to stop 2 yrs ago. Labs due today.    Past Medical History:  Diagnosis Date  . Anxiety   . Diabetes mellitus without complication (HCC)   . Hypercholesteremia   . Hypertension   . Stage 2 chronic kidney disease   . Vitamin D deficiency     Past Surgical History:  Procedure Laterality Date  . BREAST BIOPSY Right 2012   2 bx/clips-neg  . CESAREAN SECTION    . COLONOSCOPY  2015    Family History  Adopted: Yes    Social History   Socioeconomic History  . Marital status: Legally Separated    Spouse name: Not on file  . Number of children: Not on file  . Years of education: Not on file  .  Highest education level: Not on file  Occupational History  . Not on file  Tobacco Use  . Smoking status: Never Smoker  . Smokeless tobacco: Never Used  Vaping Use  . Vaping Use: Never used  Substance and Sexual Activity  . Alcohol use: No  . Drug use: No  . Sexual activity: Not Currently    Birth control/protection: Post-menopausal  Other Topics Concern  . Not on file  Social History Narrative  . Not on file   Social Determinants of Health   Financial Resource Strain:   . Difficulty of Paying Living Expenses: Not on file  Food Insecurity:   . Worried About Programme researcher, broadcasting/film/video in the Last Year: Not on file  . Ran Out of Food in the Last Year: Not on file  Transportation Needs:   . Lack of Transportation (Medical): Not on file  . Lack of Transportation (Non-Medical): Not on file  Physical Activity:   . Days of Exercise per Week: Not on file  . Minutes of Exercise per Session: Not on file  Stress:   . Feeling of Stress : Not on file  Social Connections:   . Frequency of Communication with Friends and Family: Not on file  . Frequency of Social Gatherings with Friends and Family: Not  on file  . Attends Religious Services: Not on file  . Active Member of Clubs or Organizations: Not on file  . Attends Banker Meetings: Not on file  . Marital Status: Not on file  Intimate Partner Violence:   . Fear of Current or Ex-Partner: Not on file  . Emotionally Abused: Not on file  . Physically Abused: Not on file  . Sexually Abused: Not on file    Outpatient Medications Prior to Visit  Medication Sig Dispense Refill  . atorvastatin (LIPITOR) 20 MG tablet TAKE 1 TABLET(20 MG) BY MOUTH DAILY 90 tablet 0  . lisinopril (ZESTRIL) 10 MG tablet TAKE 1 TABLET(10 MG) BY MOUTH DAILY 90 tablet 0  . citalopram (CELEXA) 40 MG tablet TAKE 1 TABLET BY MOUTH EVERY DAY. GENERIC EQUIVALENT FOR CELEXA 90 tablet 0   No facility-administered medications prior to visit.        ROS:  Review of Systems  Constitutional: Negative for fatigue, fever and unexpected weight change.  Respiratory: Negative for cough, shortness of breath and wheezing.   Cardiovascular: Negative for chest pain, palpitations and leg swelling.  Gastrointestinal: Negative for blood in stool, constipation, diarrhea, nausea and vomiting.  Endocrine: Negative for cold intolerance, heat intolerance and polyuria.  Genitourinary: Negative for dyspareunia, dysuria, flank pain, frequency, genital sores, hematuria, menstrual problem, pelvic pain, urgency, vaginal bleeding, vaginal discharge and vaginal pain.  Musculoskeletal: Negative for back pain, joint swelling and myalgias.  Skin: Negative for rash.  Neurological: Negative for dizziness, syncope, light-headedness, numbness and headaches.  Hematological: Negative for adenopathy.  Psychiatric/Behavioral: Negative for agitation, confusion, sleep disturbance and suicidal ideas. The patient is not nervous/anxious.   BREAST: No symptoms    Objective: BP 112/70   Ht 5\' 4"  (1.626 m)   Wt 187 lb (84.8 kg)   BMI 32.10 kg/m    Physical Exam Constitutional:      Appearance: She is well-developed.  Genitourinary:     Vulva, vagina, uterus, right adnexa and left adnexa normal.     No vulval lesion or tenderness noted.     No vaginal discharge, erythema or tenderness.     No cervical motion tenderness or polyp.     Uterus is not enlarged or tender.     No right or left adnexal mass present.     Right adnexa not tender.     Left adnexa not tender.  Neck:     Thyroid: No thyromegaly.  Cardiovascular:     Rate and Rhythm: Normal rate and regular rhythm.     Heart sounds: Normal heart sounds. No murmur heard.   Pulmonary:     Effort: Pulmonary effort is normal.     Breath sounds: Normal breath sounds.  Chest:     Breasts:        Right: No mass, nipple discharge, skin change or tenderness.        Left: No mass, nipple discharge, skin  change or tenderness.  Abdominal:     Palpations: Abdomen is soft.     Tenderness: There is no abdominal tenderness. There is no guarding.  Musculoskeletal:        General: Normal range of motion.     Cervical back: Normal range of motion.  Neurological:     General: No focal deficit present.     Mental Status: She is alert and oriented to person, place, and time.     Cranial Nerves: No cranial nerve deficit.  Skin:    General: Skin  is warm and dry.  Psychiatric:        Mood and Affect: Mood normal.        Behavior: Behavior normal.        Thought Content: Thought content normal.        Judgment: Judgment normal.  Vitals reviewed.     Assessment/Plan:  Encounter for annual routine gynecological examination -   Screening for breast cancer - Plan: MM 3D SCREEN BREAST BILATERAL, Pt to sched mammo  Blood tests for routine general physical examination - Plan: Comprehensive metabolic panel, Lipid panel, Hemoglobin A1c,   Screening for diabetes mellitus - Plan: Hemoglobin A1c,   Stage 2 chronic kidney disease - Plan: Comprehensive metabolic panel, On lisinopril  Hypercholesteremia - Plan: Lipid panel, Hemoglobin A1c,   Essential hypertension - Plan: BP well controlled with lisinopril.  Anxiety - Rx RF citalopram. F/u prn.  - Plan: citalopram (CELEXA) 40 MG tablet,   Will f/u with lab results and med mgmt based on levels.   Meds ordered this encounter  Medications  . citalopram (CELEXA) 40 MG tablet    Sig: Take 1 tablet (40 mg total) by mouth daily.    Dispense:  90 tablet    Refill:  3    Order Specific Question:   Supervising Provider    Answer:   Nadara Mustard [[id number]]           GYN counsel breast self exam, mammography screening, menopause, adequate intake of calcium and vitamin D, diet and exercise    F/U  Return in about 1 year (around 11/06/2020).  Delroy Ordway B. Eliel Dudding, PA-C 11/07/2019 9:12 AM

## 2019-11-07 ENCOUNTER — Encounter: Payer: Self-pay | Admitting: Obstetrics and Gynecology

## 2019-11-07 ENCOUNTER — Ambulatory Visit (INDEPENDENT_AMBULATORY_CARE_PROVIDER_SITE_OTHER): Payer: BC Managed Care – PPO | Admitting: Obstetrics and Gynecology

## 2019-11-07 ENCOUNTER — Other Ambulatory Visit: Payer: Self-pay

## 2019-11-07 VITALS — BP 112/70 | Ht 64.0 in | Wt 187.0 lb

## 2019-11-07 DIAGNOSIS — Z1231 Encounter for screening mammogram for malignant neoplasm of breast: Secondary | ICD-10-CM

## 2019-11-07 DIAGNOSIS — I10 Essential (primary) hypertension: Secondary | ICD-10-CM

## 2019-11-07 DIAGNOSIS — Z0000 Encounter for general adult medical examination without abnormal findings: Secondary | ICD-10-CM

## 2019-11-07 DIAGNOSIS — Z01419 Encounter for gynecological examination (general) (routine) without abnormal findings: Secondary | ICD-10-CM | POA: Diagnosis not present

## 2019-11-07 DIAGNOSIS — E7800 Pure hypercholesterolemia, unspecified: Secondary | ICD-10-CM

## 2019-11-07 DIAGNOSIS — F419 Anxiety disorder, unspecified: Secondary | ICD-10-CM

## 2019-11-07 DIAGNOSIS — N182 Chronic kidney disease, stage 2 (mild): Secondary | ICD-10-CM

## 2019-11-07 DIAGNOSIS — Z131 Encounter for screening for diabetes mellitus: Secondary | ICD-10-CM

## 2019-11-07 MED ORDER — CITALOPRAM HYDROBROMIDE 40 MG PO TABS: 40.0000 mg | ORAL_TABLET | Freq: Every day | ORAL | 3 refills | Status: DC

## 2019-11-07 NOTE — Patient Instructions (Signed)
I value your feedback and entrusting us with your care. If you get a [facility] patient survey, I would appreciate you taking the time to let us know about your experience today. Thank you!  As of February 22, 2019, your lab results will be released to your MyChart immediately, before I even have a chance to see them. Please give me time to review them and contact you if there are any abnormalities. Thank you for your patience.  

## 2019-11-08 ENCOUNTER — Encounter: Payer: Self-pay | Admitting: Obstetrics and Gynecology

## 2019-11-08 ENCOUNTER — Other Ambulatory Visit: Payer: Self-pay | Admitting: Obstetrics and Gynecology

## 2019-11-08 DIAGNOSIS — E7800 Pure hypercholesterolemia, unspecified: Secondary | ICD-10-CM

## 2019-11-08 LAB — COMPREHENSIVE METABOLIC PANEL
ALT: 22 IU/L (ref 0–32)
AST: 21 IU/L (ref 0–40)
Albumin/Globulin Ratio: 1.6 (ref 1.2–2.2)
Albumin: 4.6 g/dL (ref 3.8–4.9)
Alkaline Phosphatase: 88 IU/L (ref 48–121)
BUN/Creatinine Ratio: 16 (ref 9–23)
BUN: 15 mg/dL (ref 6–24)
Bilirubin Total: 0.3 mg/dL (ref 0.0–1.2)
CO2: 25 mmol/L (ref 20–29)
Calcium: 9.7 mg/dL (ref 8.7–10.2)
Chloride: 101 mmol/L (ref 96–106)
Creatinine, Ser: 0.94 mg/dL (ref 0.57–1.00)
GFR calc Af Amer: 78 mL/min/{1.73_m2} (ref >59)
GFR calc non Af Amer: 68 mL/min/{1.73_m2} (ref >59)
Globulin, Total: 2.8 g/dL (ref 1.5–4.5)
Glucose: 102 mg/dL — ABNORMAL HIGH (ref 65–99)
Potassium: 4.1 mmol/L (ref 3.5–5.2)
Sodium: 139 mmol/L (ref 134–144)
Total Protein: 7.4 g/dL (ref 6.0–8.5)

## 2019-11-08 LAB — LIPID PANEL
Chol/HDL Ratio: 3.6 ratio (ref 0.0–4.4)
Cholesterol, Total: 194 mg/dL (ref 100–199)
HDL: 54 mg/dL (ref >39)
LDL Chol Calc (NIH): 124 mg/dL — ABNORMAL HIGH (ref 0–99)
Triglycerides: 87 mg/dL (ref 0–149)
VLDL Cholesterol Cal: 16 mg/dL (ref 5–40)

## 2019-11-08 LAB — HEMOGLOBIN A1C
Est. average glucose Bld gHb Est-mCnc: 120 mg/dL
Hgb A1c MFr Bld: 5.8 % — ABNORMAL HIGH (ref 4.8–5.6)

## 2019-11-08 MED ORDER — LISINOPRIL 10 MG PO TABS: 10.0000 mg | ORAL_TABLET | Freq: Every day | ORAL | 3 refills | Status: DC

## 2019-11-08 MED ORDER — ATORVASTATIN CALCIUM 20 MG PO TABS: 20.0000 mg | ORAL_TABLET | Freq: Every day | ORAL | 3 refills | Status: DC

## 2019-11-10 ENCOUNTER — Other Ambulatory Visit: Payer: Self-pay | Admitting: Obstetrics and Gynecology

## 2019-11-10 DIAGNOSIS — F419 Anxiety disorder, unspecified: Secondary | ICD-10-CM

## 2019-11-11 NOTE — Telephone Encounter (Signed)
1 yr RF sent in at annual last wk. Pls check with pt to see if she wants filled at mail order or local [facility]. THx

## 2019-11-12 NOTE — Telephone Encounter (Addendum)
[facility] --[place]  °

## 2019-11-12 NOTE — Telephone Encounter (Signed)
She needs to decide so I know where to send Rx for her.

## 2019-11-12 NOTE — Telephone Encounter (Signed)
Pt says she has no preference where she gets it from. She went to pick up Rx already and was given  30 tablets.

## 2020-01-11 ENCOUNTER — Other Ambulatory Visit: Payer: Self-pay | Admitting: Obstetrics and Gynecology

## 2020-01-23 ENCOUNTER — Ambulatory Visit
Admission: RE | Admit: 2020-01-23 | Discharge: 2020-01-23 | Disposition: A | Discharge status: Home / Self Care | Payer: BC Managed Care – PPO | Source: Ambulatory Visit | Attending: Obstetrics and Gynecology | Admitting: Obstetrics and Gynecology

## 2020-01-23 ENCOUNTER — Other Ambulatory Visit: Payer: Self-pay

## 2020-01-23 DIAGNOSIS — Z1231 Encounter for screening mammogram for malignant neoplasm of breast: Secondary | ICD-10-CM | POA: Insufficient documentation

## 2020-11-05 ENCOUNTER — Other Ambulatory Visit: Payer: Self-pay | Admitting: Obstetrics and Gynecology

## 2020-11-05 DIAGNOSIS — F419 Anxiety disorder, unspecified: Secondary | ICD-10-CM

## 2020-11-05 DIAGNOSIS — E7800 Pure hypercholesterolemia, unspecified: Secondary | ICD-10-CM

## 2020-11-07 ENCOUNTER — Other Ambulatory Visit: Payer: Self-pay | Admitting: Obstetrics and Gynecology

## 2020-11-07 ENCOUNTER — Telehealth: Payer: Self-pay

## 2020-11-07 MED ORDER — LISINOPRIL 10 MG PO TABS: 10.0000 mg | ORAL_TABLET | Freq: Every day | ORAL | 0 refills | Status: DC

## 2020-11-07 NOTE — Telephone Encounter (Signed)
Pt has scheduled appt for 8/28th; needs refill of her BP med, chol med, and her 'happay pill'.  [facility] in [place].  [phone]

## 2020-11-07 NOTE — Telephone Encounter (Signed)
Pt aware.

## 2020-11-07 NOTE — Telephone Encounter (Signed)
2 of them RF thru pharm wed, I just sent in 3rd one.

## 2020-11-10 ENCOUNTER — Ambulatory Visit: Payer: BC Managed Care – PPO | Admitting: Obstetrics and Gynecology

## 2020-12-02 ENCOUNTER — Ambulatory Visit: Payer: BC Managed Care – PPO | Admitting: Obstetrics and Gynecology

## 2020-12-05 ENCOUNTER — Other Ambulatory Visit: Payer: Self-pay | Admitting: Obstetrics and Gynecology

## 2020-12-10 ENCOUNTER — Ambulatory Visit (INDEPENDENT_AMBULATORY_CARE_PROVIDER_SITE_OTHER): Payer: BC Managed Care – PPO | Admitting: Obstetrics and Gynecology

## 2020-12-10 ENCOUNTER — Encounter: Payer: Self-pay | Admitting: Obstetrics and Gynecology

## 2020-12-10 ENCOUNTER — Other Ambulatory Visit: Payer: Self-pay

## 2020-12-10 VITALS — BP 120/70 | Ht 64.0 in | Wt 199.0 lb

## 2020-12-10 DIAGNOSIS — E782 Mixed hyperlipidemia: Secondary | ICD-10-CM

## 2020-12-10 DIAGNOSIS — F419 Anxiety disorder, unspecified: Secondary | ICD-10-CM | POA: Diagnosis not present

## 2020-12-10 DIAGNOSIS — Z1231 Encounter for screening mammogram for malignant neoplasm of breast: Secondary | ICD-10-CM

## 2020-12-10 DIAGNOSIS — I10 Essential (primary) hypertension: Secondary | ICD-10-CM

## 2020-12-10 DIAGNOSIS — Z23 Encounter for immunization: Secondary | ICD-10-CM

## 2020-12-10 DIAGNOSIS — Z01419 Encounter for gynecological examination (general) (routine) without abnormal findings: Secondary | ICD-10-CM

## 2020-12-10 DIAGNOSIS — Z0000 Encounter for general adult medical examination without abnormal findings: Secondary | ICD-10-CM | POA: Diagnosis not present

## 2020-12-10 DIAGNOSIS — Z13220 Encounter for screening for lipoid disorders: Secondary | ICD-10-CM

## 2020-12-10 DIAGNOSIS — R7303 Prediabetes: Secondary | ICD-10-CM

## 2020-12-10 DIAGNOSIS — Z131 Encounter for screening for diabetes mellitus: Secondary | ICD-10-CM

## 2020-12-10 MED ORDER — CITALOPRAM HYDROBROMIDE 40 MG PO TABS: ORAL_TABLET | ORAL | 3 refills | Status: DC

## 2020-12-10 MED ORDER — LISINOPRIL 10 MG PO TABS: 10.0000 mg | ORAL_TABLET | Freq: Every day | ORAL | 3 refills | Status: DC

## 2020-12-10 NOTE — Patient Instructions (Addendum)
I value your feedback and you entrusting us with your care. If you get a  patient survey, I would appreciate you taking the time to let us know about your experience today. Thank you!  [facility]: [phone]      

## 2020-12-10 NOTE — Progress Notes (Signed)
PCP: Rica Records, PA-C   Chief Complaint  Patient presents with   Gynecologic Exam    No concerns   Injections    Flu shot today     HPI:      [name] is a 55 y.o. [id number] who LMP was No LMP recorded. Patient is postmenopausal., presents today for her annual examination.  Her menses are absent due to menopause.  She does not have PMB. She does not have vasomotor sx.   Sex activity: not sex active. She does not have vaginal dryness.  Last Pap: 07/11/17  Results were: no abnormalities /neg HPV DNA.  Hx of STDs: none  Last mammogram: 01/23/20  Results were: normal--routine follow-up in 12 months Pt is adopted, FH unknown. The patient does do self-breast exams.   Colonoscopy: colonoscopy 2015 without abnormalities.  Repeat due after 10 years.   Tobacco use: The patient denies current or previous tobacco use. Alcohol use: none  No drug use Exercise: moderately active  She does get adequate calcium but not Vitamin D in her diet. Not taking Vit D supp.  Pt takes celexa 40 mg daily for anxiety with sx control. No side effects. Wants to cont meds.   Pt with hx of hyperlipidemia, type 2 DM, stage 2 kidney disease, and HTN and is on lipitor 20 mg and lisinopril 10 mg. Lost 70# 3 yrs ago with keto diet/exercise changes and DM resolved, but has gained some wt back past 2 yrs. Had pre-DM on labs last yr, as well as normal lipids with lipitor and no evidence of kidney disease on lisinopril. Labs due today. Plans to restart keto for wt loss  Past Medical History:  Diagnosis Date   Anxiety    Diabetes mellitus without complication (HCC)    Hypercholesteremia    Hypertension    Stage 2 chronic kidney disease    Vitamin D deficiency     Past Surgical History:  Procedure Laterality Date   BREAST BIOPSY Right 2012   2 bx/clips-neg   CESAREAN SECTION     COLONOSCOPY  2015    Family History  Adopted: Yes    Social History   Socioeconomic History    Marital status: Legally Separated    Spouse name: Not on file   Number of children: Not on file   Years of education: Not on file   Highest education level: Not on file  Occupational History   Not on file  Tobacco Use   Smoking status: Never   Smokeless tobacco: Never  Vaping Use   Vaping Use: Never used  Substance and Sexual Activity   Alcohol use: No   Drug use: No   Sexual activity: Not Currently    Birth control/protection: Post-menopausal  Other Topics Concern   Not on file  Social History Narrative   Not on file   Social Determinants of Health   Financial Resource Strain: Not on file  Food Insecurity: Not on file  Transportation Needs: Not on file  Physical Activity: Not on file  Stress: Not on file  Social Connections: Not on file  Intimate Partner Violence: Not on file    Outpatient Medications Prior to Visit  Medication Sig Dispense Refill   atorvastatin (LIPITOR) 20 MG tablet TAKE 1 TABLET(20 MG) BY MOUTH DAILY 90 tablet 0   lisinopril (ZESTRIL) 10 MG tablet Take 1 tablet (10 mg total) by mouth daily. 90 tablet 0   citalopram (CELEXA) 40 MG tablet TAKE 1  TABLET(40 MG) BY MOUTH DAILY 90 tablet 0   No facility-administered medications prior to visit.       ROS:  Review of Systems  Constitutional:  Negative for fatigue, fever and unexpected weight change.  Respiratory:  Negative for cough, shortness of breath and wheezing.   Cardiovascular:  Negative for chest pain, palpitations and leg swelling.  Gastrointestinal:  Negative for blood in stool, constipation, diarrhea, nausea and vomiting.  Endocrine: Negative for cold intolerance, heat intolerance and polyuria.  Genitourinary:  Negative for dyspareunia, dysuria, flank pain, frequency, genital sores, hematuria, menstrual problem, pelvic pain, urgency, vaginal bleeding, vaginal discharge and vaginal pain.  Musculoskeletal:  Negative for back pain, joint swelling and myalgias.  Skin:  Negative for rash.   Neurological:  Negative for dizziness, syncope, light-headedness, numbness and headaches.  Hematological:  Negative for adenopathy.  Psychiatric/Behavioral:  Negative for agitation, confusion, sleep disturbance and suicidal ideas. The patient is not nervous/anxious.  BREAST: No symptoms    Objective: BP 120/70   Ht 5\' 4"  (1.626 m)   Wt 199 lb (90.3 kg)   BMI 34.16 kg/m    Physical Exam Constitutional:      Appearance: She is well-developed.  Genitourinary:     Vulva normal.     Right Labia: No rash, tenderness or lesions.    Left Labia: No tenderness, lesions or rash.    No vaginal discharge, erythema or tenderness.      Right Adnexa: not tender and no mass present.    Left Adnexa: not tender and no mass present.    No cervical motion tenderness, friability or polyp.     Uterus is not enlarged or tender.  Breasts:    Right: No mass, nipple discharge, skin change or tenderness.     Left: No mass, nipple discharge, skin change or tenderness.  Neck:     Thyroid: No thyromegaly.  Cardiovascular:     Rate and Rhythm: Normal rate and regular rhythm.     Heart sounds: Normal heart sounds. No murmur heard. Pulmonary:     Effort: Pulmonary effort is normal.     Breath sounds: Normal breath sounds.  Abdominal:     Palpations: Abdomen is soft.     Tenderness: There is no abdominal tenderness. There is no guarding or rebound.  Musculoskeletal:        General: Normal range of motion.     Cervical back: Normal range of motion.  Lymphadenopathy:     Cervical: No cervical adenopathy.  Neurological:     General: No focal deficit present.     Mental Status: She is alert and oriented to person, place, and time.     Cranial Nerves: No cranial nerve deficit.  Skin:    General: Skin is warm and dry.  Psychiatric:        Mood and Affect: Mood normal.        Behavior: Behavior normal.        Thought Content: Thought content normal.        Judgment: Judgment normal.  Vitals  reviewed.    Assessment/Plan: Encounter for annual routine gynecological examination  Encounter for screening mammogram for malignant neoplasm of breast - Plan: MM 3D SCREEN BREAST BILATERAL; pt to sched mammo  Anxiety - Plan: citalopram (CELEXA) 40 MG tablet; Rx RF  Blood tests for routine general physical examination - Plan: Comprehensive metabolic panel, Lipid panel, Hemoglobin A1c  Essential hypertension - Plan: Comprehensive metabolic panel; BP good. Cont lisinopril, Rx RF.  Pre-diabetes - Plan: Hemoglobin A1c  Mixed hyperlipidemia - Plan: Lipid panel; check labs. Will RF meds based on results.   Screening cholesterol level - Plan: Lipid panel  Screening for diabetes mellitus - Plan: Hemoglobin A1c; resume diet/wt loss for labs   Meds ordered this encounter  Medications   citalopram (CELEXA) 40 MG tablet    Sig: TAKE 1 TABLET(40 MG) BY MOUTH DAILY    Dispense:  90 tablet    Refill:  3    Order Specific Question:   Supervising Provider    Answer:   Nadara Mustard [[id number]]            GYN counsel breast self exam, mammography screening, menopause, adequate intake of calcium and vitamin D, diet and exercise    F/U  Return in about 1 year (around 12/10/2021).  Aman Bonet B. Vera Furniss, PA-C 12/10/2020 10:20 AM

## 2020-12-11 LAB — HEMOGLOBIN A1C
Est. average glucose Bld gHb Est-mCnc: 123 mg/dL
Hgb A1c MFr Bld: 5.9 % — ABNORMAL HIGH (ref 4.8–5.6)

## 2020-12-11 LAB — COMPREHENSIVE METABOLIC PANEL
ALT: 18 IU/L (ref 0–32)
AST: 19 IU/L (ref 0–40)
Albumin/Globulin Ratio: 1.7 (ref 1.2–2.2)
Albumin: 4.8 g/dL (ref 3.8–4.9)
Alkaline Phosphatase: 82 IU/L (ref 44–121)
BUN/Creatinine Ratio: 15 (ref 9–23)
BUN: 15 mg/dL (ref 6–24)
Bilirubin Total: 0.3 mg/dL (ref 0.0–1.2)
CO2: 24 mmol/L (ref 20–29)
Calcium: 10.1 mg/dL (ref 8.7–10.2)
Chloride: 100 mmol/L (ref 96–106)
Creatinine, Ser: 1 mg/dL (ref 0.57–1.00)
Globulin, Total: 2.8 g/dL (ref 1.5–4.5)
Glucose: 114 mg/dL — ABNORMAL HIGH (ref 70–99)
Potassium: 4.4 mmol/L (ref 3.5–5.2)
Sodium: 140 mmol/L (ref 134–144)
Total Protein: 7.6 g/dL (ref 6.0–8.5)
eGFR: 66 mL/min/{1.73_m2} (ref >59)

## 2020-12-11 LAB — LIPID PANEL
Chol/HDL Ratio: 3.6 ratio (ref 0.0–4.4)
Cholesterol, Total: 192 mg/dL (ref 100–199)
HDL: 54 mg/dL (ref >39)
LDL Chol Calc (NIH): 107 mg/dL — ABNORMAL HIGH (ref 0–99)
Triglycerides: 180 mg/dL — ABNORMAL HIGH (ref 0–149)
VLDL Cholesterol Cal: 31 mg/dL (ref 5–40)

## 2021-01-23 ENCOUNTER — Ambulatory Visit
Admission: RE | Admit: 2021-01-23 | Discharge: 2021-01-23 | Disposition: A | Discharge status: Home / Self Care | Payer: BC Managed Care – PPO | Source: Ambulatory Visit | Attending: Obstetrics and Gynecology | Admitting: Obstetrics and Gynecology

## 2021-01-23 ENCOUNTER — Other Ambulatory Visit: Payer: Self-pay

## 2021-01-23 DIAGNOSIS — Z1231 Encounter for screening mammogram for malignant neoplasm of breast: Secondary | ICD-10-CM | POA: Insufficient documentation

## 2021-02-03 ENCOUNTER — Other Ambulatory Visit: Payer: Self-pay | Admitting: Obstetrics and Gynecology

## 2021-02-03 DIAGNOSIS — E7800 Pure hypercholesterolemia, unspecified: Secondary | ICD-10-CM

## 2021-02-26 ENCOUNTER — Telehealth: Payer: Self-pay

## 2021-02-26 NOTE — Telephone Encounter (Signed)
Pt left msg on triage for [facility]. Pt started having sx Tuesday night and tested positive for Covid. Wants to know if [facility] "is willing to call that medicine that gets called in nowadays." She's been coughing, mild sore throat, congestion, fever, feeling tired. Took Robitussin and it didn't help. Is trying dayquil and nyquil tonight. Pt is aware [facility] out of the office and will respond at early convenience. Pt says she thought giving it a try to ask for medicine.

## 2021-02-26 NOTE — Telephone Encounter (Signed)
How bad are symptoms? There are side effects for some with the covid medication. If her symptoms are not bad, she's probably better off just with supportive care. Cold and sinus medications over the counter, mucinex, robitussin, etc. Usually only lasts a few days. If symptoms are bad, I will send in Rx for her

## 2021-02-27 NOTE — Telephone Encounter (Signed)
Spoke w/patient. She has had covid last year and has had vaccines. She states she is definitely not as bad as she was last year and will stick with using the OTC meds.

## 2021-09-29 ENCOUNTER — Other Ambulatory Visit: Payer: Self-pay | Admitting: Orthopedic Surgery

## 2021-09-29 DIAGNOSIS — M1712 Unilateral primary osteoarthritis, left knee: Secondary | ICD-10-CM

## 2021-09-29 DIAGNOSIS — M1711 Unilateral primary osteoarthritis, right knee: Secondary | ICD-10-CM

## 2021-09-29 DIAGNOSIS — M2391 Unspecified internal derangement of right knee: Secondary | ICD-10-CM

## 2021-09-29 DIAGNOSIS — M2351 Chronic instability of knee, right knee: Secondary | ICD-10-CM

## 2021-10-05 ENCOUNTER — Ambulatory Visit
Admission: RE | Admit: 2021-10-05 | Discharge: 2021-10-05 | Disposition: A | Discharge status: Home / Self Care | Payer: BC Managed Care – PPO | Source: Ambulatory Visit | Attending: Orthopedic Surgery | Admitting: Orthopedic Surgery

## 2021-10-05 DIAGNOSIS — M1712 Unilateral primary osteoarthritis, left knee: Secondary | ICD-10-CM

## 2021-10-05 DIAGNOSIS — M1711 Unilateral primary osteoarthritis, right knee: Secondary | ICD-10-CM

## 2021-10-05 DIAGNOSIS — M2391 Unspecified internal derangement of right knee: Secondary | ICD-10-CM

## 2021-10-05 DIAGNOSIS — M2351 Chronic instability of knee, right knee: Secondary | ICD-10-CM

## 2021-11-15 IMAGING — MG MM DIGITAL SCREENING BILAT W/ TOMO AND CAD
8 series · 8 of 24 positions shown · non-contrast
Comparison: Previous exam(s).

CLINICAL DATA: Screening.

EXAM:
DIGITAL SCREENING BILATERAL MAMMOGRAM WITH TOMOSYNTHESIS AND CAD
TECHNIQUE: Bilateral screening digital craniocaudal and mediolateral oblique
mammograms were obtained. Bilateral screening digital breast
tomosynthesis was performed. The images were evaluated with
computer-aided detection.

[L CC synth-2D]
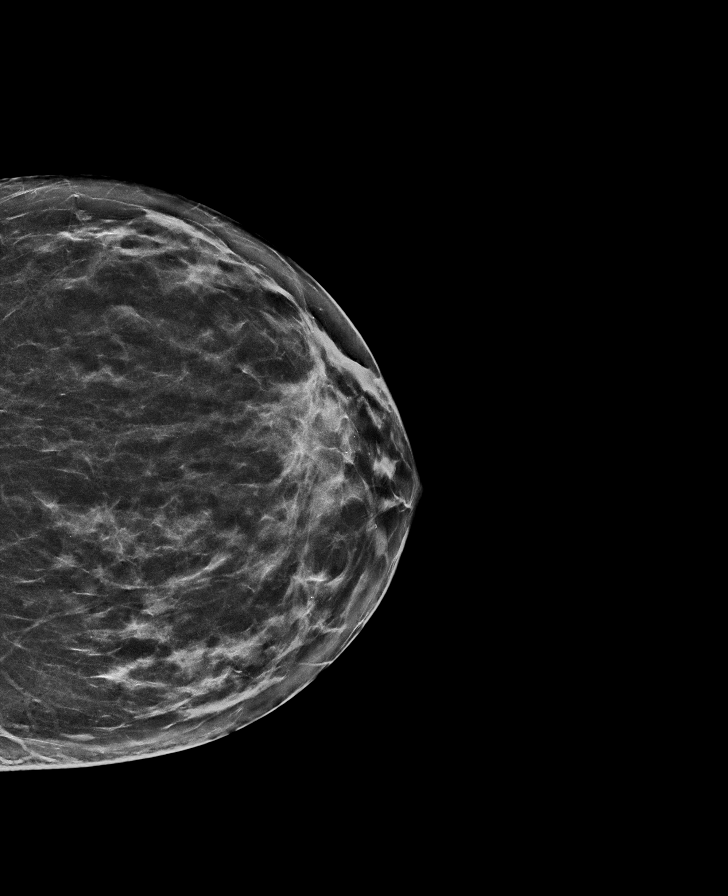

[L MLO synth-2D]
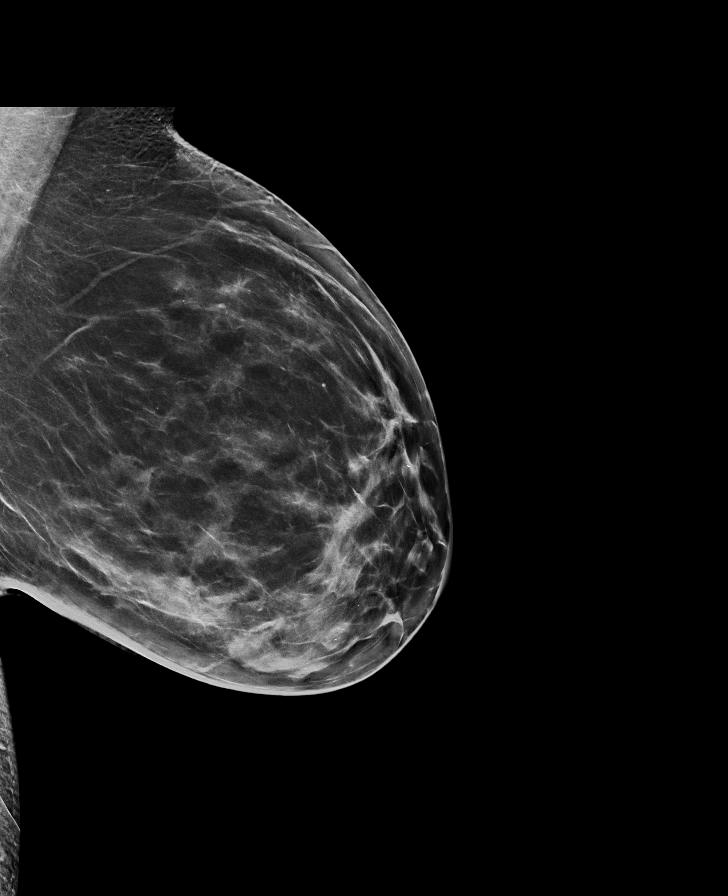

[R MLO synth-2D]
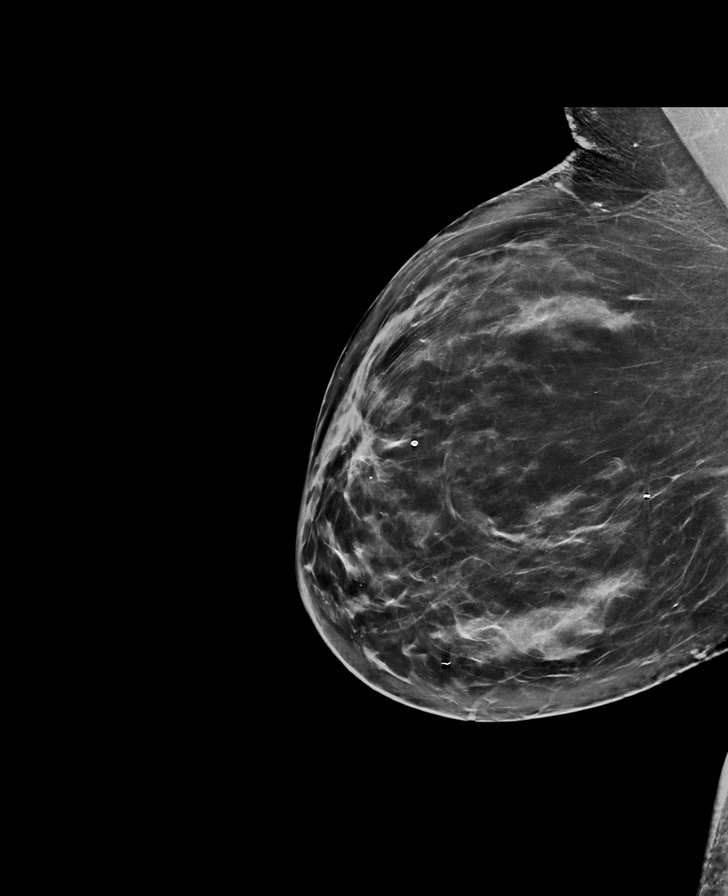

[R CC synth-2D]
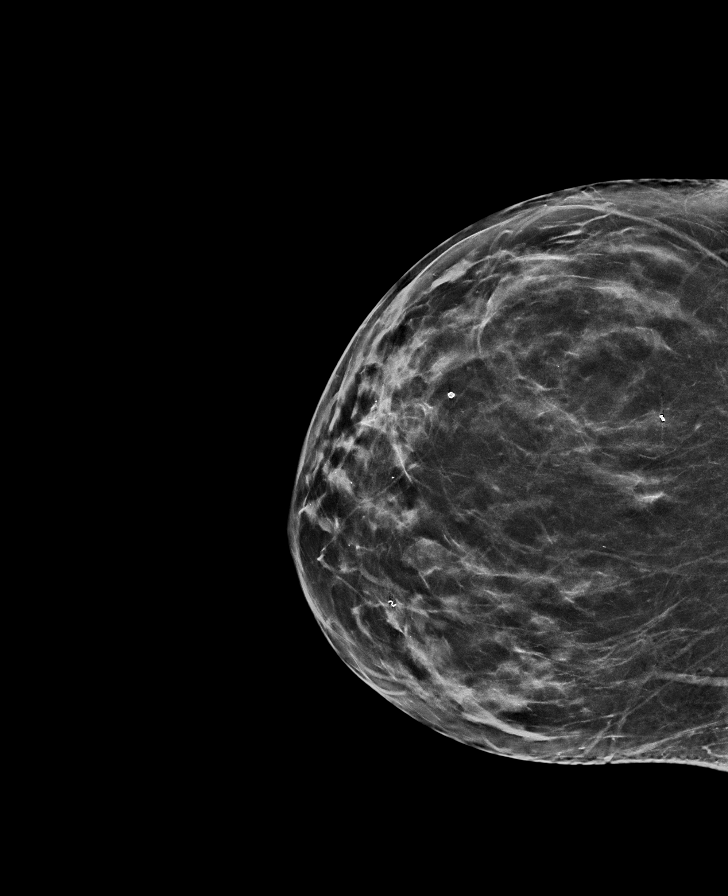

[L MLO tomo · tomo slice 42/83.0]
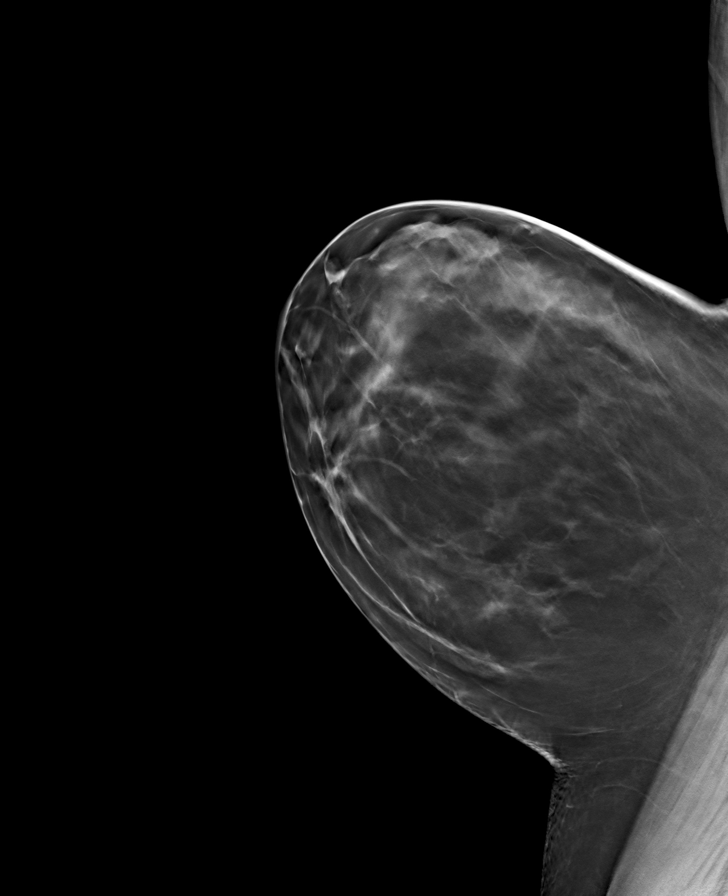

[L CC tomo · tomo slice 33/66.0]
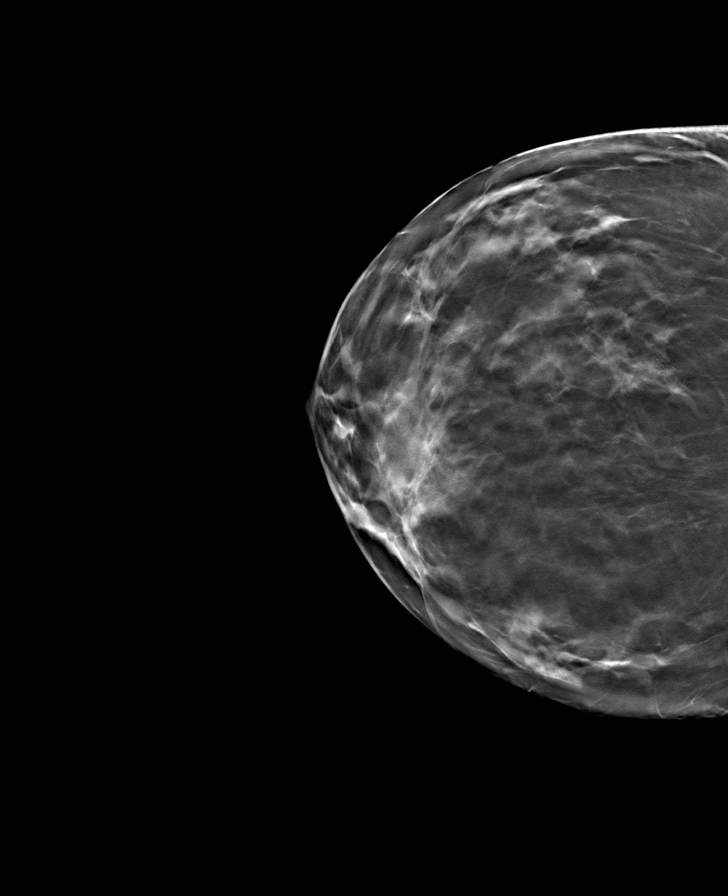

[R CC tomo · tomo slice 34/67.0]
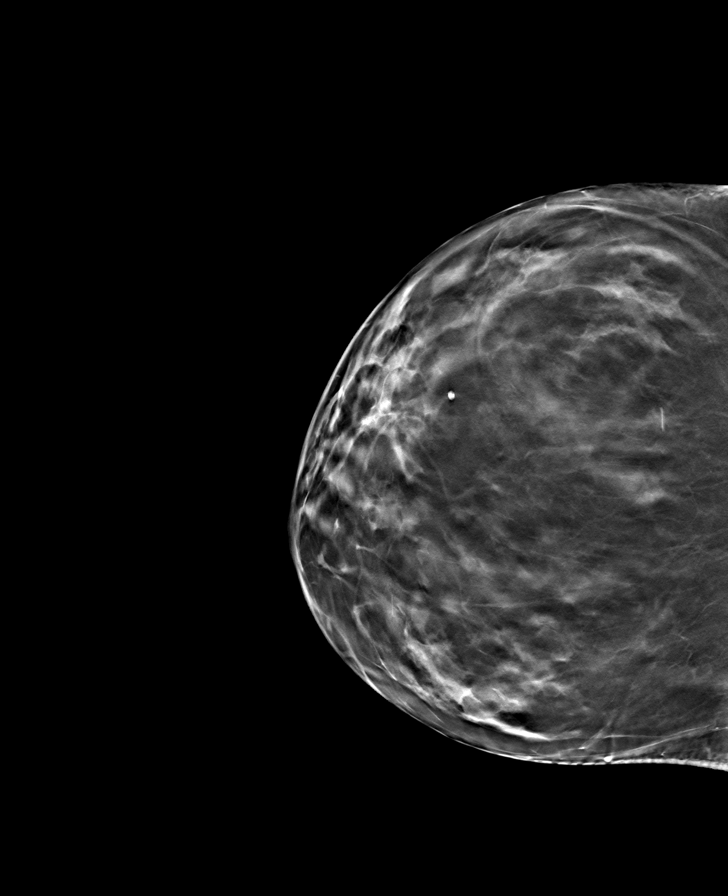

[R MLO tomo · tomo slice 41/82.0]
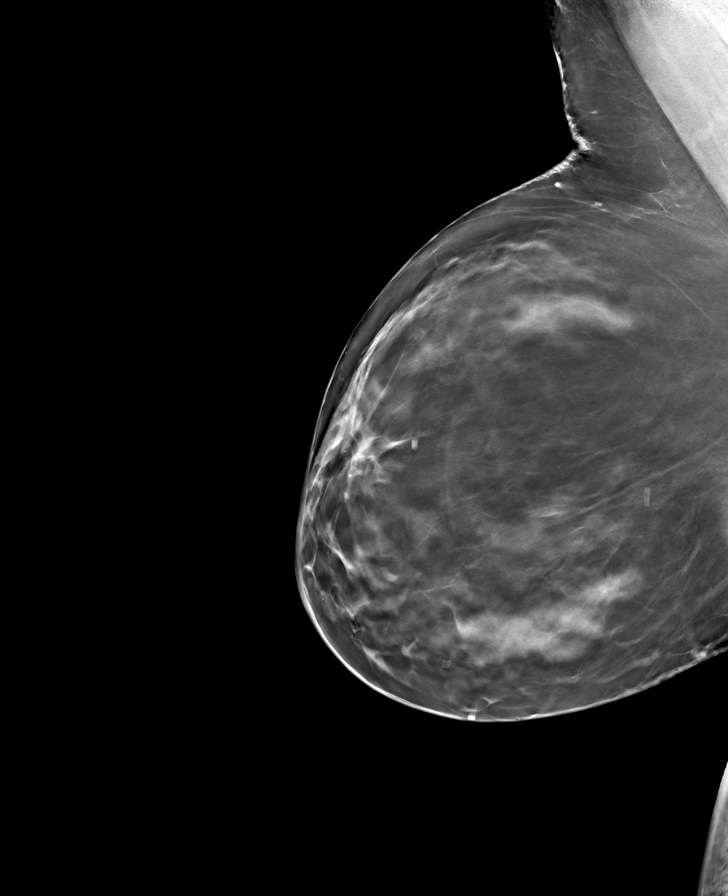

[8 of 24 positions shown; findings below may reference images not displayed]

ACR Breast Density Category c: The breast tissue is heterogeneously
dense, which may obscure small masses.
FINDINGS: There are no findings suspicious for malignancy.
IMPRESSION: No mammographic evidence of malignancy. A result letter of this
screening mammogram will be mailed directly to the patient.

RECOMMENDATION:
Screening mammogram in one year. (Code:[id number])

BI-RADS CATEGORY  1: Negative.

## 2021-11-17 ENCOUNTER — Telehealth: Payer: Self-pay

## 2021-11-17 ENCOUNTER — Other Ambulatory Visit: Payer: Self-pay | Admitting: Obstetrics and Gynecology

## 2021-11-17 DIAGNOSIS — I10 Essential (primary) hypertension: Secondary | ICD-10-CM

## 2021-11-17 DIAGNOSIS — E7800 Pure hypercholesterolemia, unspecified: Secondary | ICD-10-CM

## 2021-11-17 DIAGNOSIS — F419 Anxiety disorder, unspecified: Secondary | ICD-10-CM

## 2021-11-17 MED ORDER — LISINOPRIL 10 MG PO TABS: 10.0000 mg | ORAL_TABLET | Freq: Every day | ORAL | 0 refills | Status: DC

## 2021-11-17 MED ORDER — ATORVASTATIN CALCIUM 20 MG PO TABS: ORAL_TABLET | ORAL | 0 refills | Status: DC

## 2021-11-17 MED ORDER — CITALOPRAM HYDROBROMIDE 40 MG PO TABS: ORAL_TABLET | ORAL | 0 refills | Status: DC

## 2021-11-17 NOTE — Telephone Encounter (Signed)
Rx RF eRxd.  

## 2021-11-17 NOTE — Telephone Encounter (Signed)
Pt aware.

## 2021-11-17 NOTE — Telephone Encounter (Signed)
Pt called our office and said on 12/03/21 she is having surgery and would like to get refills on citalopram, atorvastatin, and lisinopril. She is aware she is due for her annual at the end of this month and she says as soon as she can after surgery recovery she will schedule her annual. Pls advise.

## 2021-11-17 NOTE — Progress Notes (Signed)
Rx RF meds till annual, due 9/23. Pt having surgery and will schedule when she can

## 2021-11-18 ENCOUNTER — Other Ambulatory Visit: Payer: Self-pay | Admitting: Surgery

## 2021-11-25 ENCOUNTER — Encounter
Admission: RE | Admit: 2021-11-25 | Discharge: 2021-11-25 | Disposition: A | Discharge status: Home / Self Care | Payer: BC Managed Care – PPO | Source: Ambulatory Visit | Attending: Surgery | Admitting: Surgery

## 2021-11-25 DIAGNOSIS — Z01818 Encounter for other preprocedural examination: Secondary | ICD-10-CM | POA: Insufficient documentation

## 2021-11-25 DIAGNOSIS — Z01812 Encounter for preprocedural laboratory examination: Secondary | ICD-10-CM

## 2021-11-25 LAB — URINALYSIS, ROUTINE W REFLEX MICROSCOPIC
Bilirubin Urine: NEGATIVE
Glucose, UA: NEGATIVE mg/dL
Hgb urine dipstick: NEGATIVE
Ketones, ur: NEGATIVE mg/dL
Leukocytes,Ua: NEGATIVE
Nitrite: NEGATIVE
Protein, ur: NEGATIVE mg/dL
Specific Gravity, Urine: 1.021 (ref 1.005–1.030)
pH: 5 (ref 5.0–8.0)

## 2021-11-25 LAB — COMPREHENSIVE METABOLIC PANEL
ALT: 19 U/L (ref 0–44)
AST: 18 U/L (ref 15–41)
Albumin: 4.4 g/dL (ref 3.5–5.0)
Alkaline Phosphatase: 84 U/L (ref 38–126)
Anion gap: 10 (ref 5–15)
BUN: 20 mg/dL (ref 6–20)
CO2: 23 mmol/L (ref 22–32)
Calcium: 9.7 mg/dL (ref 8.9–10.3)
Chloride: 105 mmol/L (ref 98–111)
Creatinine, Ser: 0.91 mg/dL (ref 0.44–1.00)
GFR, Estimated: >60 mL/min (ref >60)
Glucose, Bld: 109 mg/dL — ABNORMAL HIGH (ref 70–99)
Potassium: 4 mmol/L (ref 3.5–5.1)
Sodium: 138 mmol/L (ref 135–145)
Total Bilirubin: 0.9 mg/dL (ref 0.3–1.2)
Total Protein: 8.1 g/dL (ref 6.5–8.1)

## 2021-11-25 LAB — TYPE AND SCREEN
ABO/RH(D): B POS
Antibody Screen: NEGATIVE

## 2021-11-25 LAB — CBC WITH DIFFERENTIAL/PLATELET
Abs Immature Granulocytes: 0.02 10*3/uL (ref 0.00–0.07)
Basophils Absolute: 0 10*3/uL (ref 0.0–0.1)
Basophils Relative: 1 %
Eosinophils Absolute: 0.2 10*3/uL (ref 0.0–0.5)
Eosinophils Relative: 4 %
HCT: 41.3 % (ref 36.0–46.0)
Hemoglobin: 13.8 g/dL (ref 12.0–15.0)
Immature Granulocytes: 0 %
Lymphocytes Relative: 42 %
Lymphs Abs: 2.4 10*3/uL (ref 0.7–4.0)
MCH: 29.7 pg (ref 26.0–34.0)
MCHC: 33.4 g/dL (ref 30.0–36.0)
MCV: 88.8 fL (ref 80.0–100.0)
Monocytes Absolute: 0.4 10*3/uL (ref 0.1–1.0)
Monocytes Relative: 6 %
Neutro Abs: 2.7 10*3/uL (ref 1.7–7.7)
Neutrophils Relative %: 47 %
Platelets: 212 10*3/uL (ref 150–400)
RBC: 4.65 MIL/uL (ref 3.87–5.11)
RDW: 12.6 % (ref 11.5–15.5)
WBC: 5.8 10*3/uL (ref 4.0–10.5)
nRBC: 0 % (ref 0.0–0.2)

## 2021-11-25 LAB — SURGICAL PCR SCREEN
MRSA, PCR: NEGATIVE
Staphylococcus aureus: NEGATIVE

## 2021-11-25 NOTE — Patient Instructions (Addendum)
Your procedure is scheduled on: Thursday, September 21 Report to the Registration Desk on the 1st floor of the [facility] To find out your arrival time, please call [phone] between 1PM - 3PM on: Wednesday, September 20 If your arrival time is 6:00 am, do not arrive prior to that time as the [facility] entrance doors do not open until 6:00 am.  REMEMBER: Instructions that are not followed completely may result in serious medical risk, up to and including death; or upon the discretion of your surgeon and anesthesiologist your surgery may need to be rescheduled.  Do not eat food after midnight the night before surgery.  No gum chewing, lozengers or hard candies.  You may however, drink water up to 2 hours before you are scheduled to arrive for your surgery. Do not drink anything within 2 hours of your scheduled arrival time.  In addition, your doctor has ordered for you to drink the provided  Gatorade G2 Drinking this carbohydrate drink up to two hours before surgery helps to reduce insulin resistance and improve patient outcomes. Please complete drinking 2 hours prior to scheduled arrival time.  DO NOT TAKE ANY MEDICATIONS THE MORNING OF SURGERY  One week prior to surgery: starting September 14 Stop Anti-inflammatories (NSAIDS) such as Advil, Aleve, Ibuprofen, Motrin, Naproxen, Naprosyn and Aspirin based products such as Excedrin, Goodys Powder, BC Powder. Stop ANY OVER THE COUNTER supplements until after surgery. You may however, continue to take Tylenol if needed for pain up until the day of surgery.  No Alcohol for 24 hours before or after surgery.  No Smoking including e-cigarettes for 24 hours prior to surgery.  No chewable tobacco products for at least 6 hours prior to surgery.  No nicotine patches on the day of surgery.  Do not use any "recreational" drugs for at least a week prior to your surgery.  Please be advised that the combination of cocaine and anesthesia may  have negative outcomes, up to and including death. If you test positive for cocaine, your surgery will be cancelled.  On the morning of surgery brush your teeth with toothpaste and water, you may rinse your mouth with mouthwash if you wish. Do not swallow any toothpaste or mouthwash.  Use CHG Soap as directed on instruction sheet.  Do not wear jewelry, make-up, hairpins, clips or nail polish.  Do not wear lotions, powders, or perfumes.   Do not shave body from the neck down 48 hours prior to surgery just in case you cut yourself which could leave a site for infection.  Also, freshly shaved skin may become irritated if using the CHG soap.  Do not bring valuables to the hospital. [facility] is not responsible for any missing/lost belongings or valuables.   Notify your doctor if there is any change in your medical condition (cold, fever, infection).  Wear comfortable clothing (specific to your surgery type) to the hospital.  After surgery, you can help prevent lung complications by doing breathing exercises.  Take deep breaths and cough every 1-2 hours. Your doctor may order a device called an Incentive Spirometer to help you take deep breaths.  If you are being admitted to the hospital overnight, leave your suitcase in the car. After surgery it may be brought to your room.  If you are being discharged the day of surgery, you will not be allowed to drive home. You will need a responsible adult (18 years or older) to drive you home and stay with you that  night.   If you are taking public transportation, you will need to have a responsible adult (18 years or older) with you. Please confirm with your physician that it is acceptable to use public transportation.   Please call the Pre-admissions Testing Dept. at [phone] if you have any questions about these instructions.  Surgery Visitation Policy:  Patients undergoing a surgery or procedure may have two family members or support  persons with them as long as the person is not COVID-19 positive or experiencing its symptoms.   Inpatient Visitation:    Visiting hours are 7 a.m. to 8 p.m. Up to four visitors are allowed at one time in a patient room, including children. The visitors may rotate out with other people during the day. One designated support person (adult) may remain overnight.      Preparing for Surgery with CHLORHEXIDINE GLUCONATE (CHG) Soap  Before surgery, you can play an important role by reducing the number of germs on your skin.  CHG (Chlorhexidine gluconate) soap is an antiseptic cleanser which kills germs and bonds with the skin to continue killing germs even after washing.  Please do not use if you have an allergy to CHG or antibacterial soaps. If your skin becomes reddened/irritated stop using the CHG.  1. Shower the NIGHT BEFORE SURGERY and the MORNING OF SURGERY with CHG soap.  2. If you choose to wash your hair, wash your hair first as usual with your normal shampoo.  3. After shampooing, rinse your hair and body thoroughly to remove the shampoo.  4. Use CHG as you would any other liquid soap. You can apply CHG directly to the skin and wash gently with a scrungie or a clean washcloth.  5. Apply the CHG soap to your body only from the neck down. Do not use on open wounds or open sores. Avoid contact with your eyes, ears, mouth, and genitals (private parts). Wash face and genitals (private parts) with your normal soap.  6. Wash thoroughly, paying special attention to the area where your surgery will be performed.  7. Thoroughly rinse your body with warm water.  8. Do not shower/wash with your normal soap after using and rinsing off the CHG soap.  9. Pat yourself dry with a clean towel.  10. Wear clean pajamas to bed the night before surgery.  12. Place clean sheets on your bed the night of your first shower and do not sleep with pets.  13. Shower again with the CHG soap on the day of  surgery prior to arriving at the hospital.  14. Do not apply any deodorants/lotions/powders.  15. Please wear clean clothes to the hospital.

## 2021-12-03 ENCOUNTER — Encounter: Payer: Self-pay | Admitting: Surgery

## 2021-12-03 ENCOUNTER — Encounter
Admission: RE | Disposition: A | Discharge status: Home / Self Care | Payer: Self-pay | Source: Home / Self Care | Attending: Surgery

## 2021-12-03 ENCOUNTER — Ambulatory Visit: Payer: BC Managed Care – PPO | Admitting: Urgent Care

## 2021-12-03 ENCOUNTER — Other Ambulatory Visit: Payer: Self-pay

## 2021-12-03 ENCOUNTER — Observation Stay: Payer: BC Managed Care – PPO

## 2021-12-03 ENCOUNTER — Observation Stay
Admission: RE | Admit: 2021-12-03 | Discharge: 2021-12-04 | Disposition: A | Discharge status: Home / Self Care | Payer: BC Managed Care – PPO | Attending: Surgery | Admitting: Surgery

## 2021-12-03 DIAGNOSIS — E669 Obesity, unspecified: Secondary | ICD-10-CM | POA: Diagnosis not present

## 2021-12-03 DIAGNOSIS — Z79899 Other long term (current) drug therapy: Secondary | ICD-10-CM | POA: Diagnosis not present

## 2021-12-03 DIAGNOSIS — E1122 Type 2 diabetes mellitus with diabetic chronic kidney disease: Secondary | ICD-10-CM | POA: Diagnosis not present

## 2021-12-03 DIAGNOSIS — Z6834 Body mass index (BMI) 34.0-34.9, adult: Secondary | ICD-10-CM | POA: Insufficient documentation

## 2021-12-03 DIAGNOSIS — M1711 Unilateral primary osteoarthritis, right knee: Principal | ICD-10-CM | POA: Insufficient documentation

## 2021-12-03 DIAGNOSIS — Z01812 Encounter for preprocedural laboratory examination: Secondary | ICD-10-CM

## 2021-12-03 DIAGNOSIS — Z96651 Presence of right artificial knee joint: Secondary | ICD-10-CM

## 2021-12-03 DIAGNOSIS — N182 Chronic kidney disease, stage 2 (mild): Secondary | ICD-10-CM | POA: Insufficient documentation

## 2021-12-03 DIAGNOSIS — I129 Hypertensive chronic kidney disease with stage 1 through stage 4 chronic kidney disease, or unspecified chronic kidney disease: Secondary | ICD-10-CM | POA: Diagnosis not present

## 2021-12-03 DIAGNOSIS — E119 Type 2 diabetes mellitus without complications: Secondary | ICD-10-CM

## 2021-12-03 HISTORY — PX: TOTAL KNEE ARTHROPLASTY: SHX125

## 2021-12-03 LAB — ABO/RH: ABO/RH(D): B POS

## 2021-12-03 SURGERY — ARTHROPLASTY, KNEE, TOTAL
Anesthesia: Regional | Site: Knee | Laterality: Right

## 2021-12-03 MED ORDER — ONDANSETRON HCL 4 MG/2ML IJ SOLN
INTRAMUSCULAR | Status: DC | PRN
  Administered 2021-12-03: 4 mg via INTRAVENOUS

## 2021-12-03 MED ORDER — MIDAZOLAM HCL 2 MG/2ML IJ SOLN
INTRAMUSCULAR | Status: AC
  Filled 2021-12-03: qty 2

## 2021-12-03 MED ORDER — FENTANYL CITRATE (PF) 100 MCG/2ML IJ SOLN
INTRAMUSCULAR | Status: DC | PRN
  Administered 2021-12-03 (×4): 50 ug via INTRAVENOUS

## 2021-12-03 MED ORDER — CEFAZOLIN SODIUM-DEXTROSE 2-4 GM/100ML-% IV SOLN
2.0000 g | Freq: Four times a day (QID) | INTRAVENOUS | Status: AC
  Administered 2021-12-03 – 2021-12-04 (×3): 2 g via INTRAVENOUS
  Filled 2021-12-03 (×3): qty 100

## 2021-12-03 MED ORDER — DEXMEDETOMIDINE HCL IN NACL 80 MCG/20ML IV SOLN
INTRAVENOUS | Status: DC | PRN
  Administered 2021-12-03: 4 ug via BUCCAL
  Administered 2021-12-03: 8 ug via BUCCAL

## 2021-12-03 MED ORDER — DIPHENHYDRAMINE HCL 12.5 MG/5ML PO ELIX: 12.5000 mg | ORAL_SOLUTION | ORAL | Status: DC | PRN

## 2021-12-03 MED ORDER — MELATONIN 5 MG PO TABS
2.5000 mg | ORAL_TABLET | Freq: Once | ORAL | Status: AC
  Administered 2021-12-03: 2.5 mg via ORAL
  Filled 2021-12-03: qty 1

## 2021-12-03 MED ORDER — CHLORHEXIDINE GLUCONATE 0.12 % MT SOLN
OROMUCOSAL | Status: AC
  Administered 2021-12-03: 15 mL via OROMUCOSAL
  Filled 2021-12-03: qty 15

## 2021-12-03 MED ORDER — ONDANSETRON HCL 4 MG PO TABS: 4.0000 mg | ORAL_TABLET | Freq: Four times a day (QID) | ORAL | Status: DC | PRN

## 2021-12-03 MED ORDER — HYDROMORPHONE HCL 1 MG/ML IJ SOLN
INTRAMUSCULAR | Status: AC
  Administered 2021-12-03: 0.5 mg via INTRAVENOUS
  Filled 2021-12-03: qty 1

## 2021-12-03 MED ORDER — FENTANYL CITRATE (PF) 100 MCG/2ML IJ SOLN
INTRAMUSCULAR | Status: AC
  Filled 2021-12-03: qty 2

## 2021-12-03 MED ORDER — KETOROLAC TROMETHAMINE 30 MG/ML IJ SOLN: 30.0000 mg | Freq: Once | INTRAMUSCULAR | Status: AC

## 2021-12-03 MED ORDER — PROPOFOL 500 MG/50ML IV EMUL
INTRAVENOUS | Status: DC | PRN
  Administered 2021-12-03: 25 ug/kg/min via INTRAVENOUS

## 2021-12-03 MED ORDER — BUPIVACAINE HCL (PF) 0.5 % IJ SOLN
INTRAMUSCULAR | Status: AC
  Filled 2021-12-03: qty 10

## 2021-12-03 MED ORDER — DOCUSATE SODIUM 100 MG PO CAPS
100.0000 mg | ORAL_CAPSULE | Freq: Two times a day (BID) | ORAL | Status: DC
  Administered 2021-12-03 – 2021-12-04 (×2): 100 mg via ORAL
  Filled 2021-12-03 (×2): qty 1

## 2021-12-03 MED ORDER — SODIUM CHLORIDE 0.9 % IV SOLN: INTRAVENOUS | Status: DC

## 2021-12-03 MED ORDER — BUPIVACAINE LIPOSOME 1.3 % IJ SUSP
INTRAMUSCULAR | Status: AC
  Filled 2021-12-03: qty 20

## 2021-12-03 MED ORDER — BUPIVACAINE-EPINEPHRINE (PF) 0.5% -1:200000 IJ SOLN
INTRAMUSCULAR | Status: AC
  Filled 2021-12-03: qty 30

## 2021-12-03 MED ORDER — HYDROMORPHONE HCL 1 MG/ML IJ SOLN
0.2500 mg | INTRAMUSCULAR | Status: DC | PRN
  Administered 2021-12-03 (×2): 0.5 mg via INTRAVENOUS

## 2021-12-03 MED ORDER — BISACODYL 10 MG RE SUPP: 10.0000 mg | Freq: Every day | RECTAL | Status: DC | PRN

## 2021-12-03 MED ORDER — LIDOCAINE HCL (CARDIAC) PF 100 MG/5ML IV SOSY
PREFILLED_SYRINGE | INTRAVENOUS | Status: DC | PRN
  Administered 2021-12-03: 90 mg via INTRAVENOUS

## 2021-12-03 MED ORDER — PROMETHAZINE HCL 25 MG/ML IJ SOLN: 6.2500 mg | INTRAMUSCULAR | Status: DC | PRN

## 2021-12-03 MED ORDER — LISINOPRIL 10 MG PO TABS: 10.0000 mg | ORAL_TABLET | Freq: Every day | ORAL | Status: DC

## 2021-12-03 MED ORDER — TRANEXAMIC ACID 1000 MG/10ML IV SOLN
INTRAVENOUS | Status: DC | PRN
  Administered 2021-12-03: 1000 mg via TOPICAL

## 2021-12-03 MED ORDER — SODIUM CHLORIDE FLUSH 0.9 % IV SOLN
INTRAVENOUS | Status: AC
  Filled 2021-12-03: qty 10

## 2021-12-03 MED ORDER — TRIAMCINOLONE ACETONIDE 40 MG/ML IJ SUSP
INTRAMUSCULAR | Status: AC
  Filled 2021-12-03: qty 1

## 2021-12-03 MED ORDER — PROPOFOL 1000 MG/100ML IV EMUL
INTRAVENOUS | Status: AC
  Filled 2021-12-03: qty 100

## 2021-12-03 MED ORDER — PROPOFOL 10 MG/ML IV BOLUS
INTRAVENOUS | Status: AC
  Filled 2021-12-03: qty 20

## 2021-12-03 MED ORDER — FLEET ENEMA 7-19 GM/118ML RE ENEM: 1.0000 | ENEMA | Freq: Once | RECTAL | Status: DC | PRN

## 2021-12-03 MED ORDER — CITALOPRAM HYDROBROMIDE 10 MG PO TABS: 40.0000 mg | ORAL_TABLET | Freq: Every day | ORAL | Status: DC

## 2021-12-03 MED ORDER — KETOROLAC TROMETHAMINE 15 MG/ML IJ SOLN
15.0000 mg | Freq: Four times a day (QID) | INTRAMUSCULAR | Status: AC
  Administered 2021-12-03 – 2021-12-04 (×4): 15 mg via INTRAVENOUS
  Filled 2021-12-03 (×4): qty 1

## 2021-12-03 MED ORDER — OXYCODONE HCL 5 MG PO TABS: 5.0000 mg | ORAL_TABLET | Freq: Once | ORAL | Status: DC | PRN

## 2021-12-03 MED ORDER — SODIUM CHLORIDE FLUSH 0.9 % IV SOLN
INTRAVENOUS | Status: AC
  Filled 2021-12-03: qty 40

## 2021-12-03 MED ORDER — ACETAMINOPHEN 10 MG/ML IV SOLN
INTRAVENOUS | Status: DC | PRN
  Administered 2021-12-03: 1000 mg via INTRAVENOUS

## 2021-12-03 MED ORDER — HYDROMORPHONE HCL 1 MG/ML IJ SOLN: 0.2500 mg | INTRAMUSCULAR | Status: DC | PRN

## 2021-12-03 MED ORDER — ROCURONIUM BROMIDE 100 MG/10ML IV SOLN
INTRAVENOUS | Status: DC | PRN
  Administered 2021-12-03: 50 mg via INTRAVENOUS
  Administered 2021-12-03: 20 mg via INTRAVENOUS
  Administered 2021-12-03: 10 mg via INTRAVENOUS

## 2021-12-03 MED ORDER — LIDOCAINE HCL (PF) 1 % IJ SOLN
INTRAMUSCULAR | Status: AC
  Filled 2021-12-03: qty 5

## 2021-12-03 MED ORDER — APIXABAN 2.5 MG PO TABS
2.5000 mg | ORAL_TABLET | Freq: Two times a day (BID) | ORAL | Status: DC
  Administered 2021-12-04: 2.5 mg via ORAL
  Filled 2021-12-03: qty 1

## 2021-12-03 MED ORDER — ACETAMINOPHEN 325 MG PO TABS: 325.0000 mg | ORAL_TABLET | Freq: Four times a day (QID) | ORAL | Status: DC | PRN

## 2021-12-03 MED ORDER — ORAL CARE MOUTH RINSE: 15.0000 mL | Freq: Once | OROMUCOSAL | Status: AC

## 2021-12-03 MED ORDER — HYDROMORPHONE HCL 1 MG/ML IJ SOLN
INTRAMUSCULAR | Status: DC | PRN
  Administered 2021-12-03 (×2): .5 mg via INTRAVENOUS

## 2021-12-03 MED ORDER — ACETAMINOPHEN 10 MG/ML IV SOLN
INTRAVENOUS | Status: AC
  Filled 2021-12-03: qty 100

## 2021-12-03 MED ORDER — DEXAMETHASONE SODIUM PHOSPHATE 10 MG/ML IJ SOLN
INTRAMUSCULAR | Status: AC
  Filled 2021-12-03: qty 1

## 2021-12-03 MED ORDER — CHLORHEXIDINE GLUCONATE 0.12 % MT SOLN: 15.0000 mL | Freq: Once | OROMUCOSAL | Status: AC

## 2021-12-03 MED ORDER — CEFAZOLIN SODIUM-DEXTROSE 2-4 GM/100ML-% IV SOLN
2.0000 g | INTRAVENOUS | Status: AC
  Administered 2021-12-03: 2 g via INTRAVENOUS

## 2021-12-03 MED ORDER — ATORVASTATIN CALCIUM 20 MG PO TABS: 20.0000 mg | ORAL_TABLET | Freq: Every day | ORAL | Status: DC

## 2021-12-03 MED ORDER — METOCLOPRAMIDE HCL 5 MG/ML IJ SOLN: 5.0000 mg | Freq: Three times a day (TID) | INTRAMUSCULAR | Status: DC | PRN

## 2021-12-03 MED ORDER — PHENYLEPHRINE 80 MCG/ML (10ML) SYRINGE FOR IV PUSH (FOR BLOOD PRESSURE SUPPORT)
PREFILLED_SYRINGE | INTRAVENOUS | Status: DC | PRN
  Administered 2021-12-03 (×3): 160 ug via INTRAVENOUS

## 2021-12-03 MED ORDER — KETOROLAC TROMETHAMINE 30 MG/ML IJ SOLN
INTRAMUSCULAR | Status: AC
  Administered 2021-12-03: 30 mg via INTRAVENOUS
  Filled 2021-12-03: qty 1

## 2021-12-03 MED ORDER — TRANEXAMIC ACID 1000 MG/10ML IV SOLN
INTRAVENOUS | Status: AC
  Filled 2021-12-03: qty 10

## 2021-12-03 MED ORDER — FAMOTIDINE 20 MG PO TABS: 20.0000 mg | ORAL_TABLET | Freq: Once | ORAL | Status: DC

## 2021-12-03 MED ORDER — TRIAMCINOLONE ACETONIDE 40 MG/ML IJ SUSP
INTRAMUSCULAR | Status: DC | PRN
  Administered 2021-12-03: 93 mL via INTRAMUSCULAR

## 2021-12-03 MED ORDER — HYDROMORPHONE HCL 1 MG/ML IJ SOLN
INTRAMUSCULAR | Status: AC
  Filled 2021-12-03: qty 1

## 2021-12-03 MED ORDER — MAGNESIUM HYDROXIDE 400 MG/5ML PO SUSP: 30.0000 mL | Freq: Every day | ORAL | Status: DC | PRN

## 2021-12-03 MED ORDER — DEXAMETHASONE SODIUM PHOSPHATE 10 MG/ML IJ SOLN
INTRAMUSCULAR | Status: DC | PRN
  Administered 2021-12-03: 5 mg via INTRAVENOUS

## 2021-12-03 MED ORDER — MAGNESIUM OXIDE -MG SUPPLEMENT 400 (240 MG) MG PO TABS
400.0000 mg | ORAL_TABLET | Freq: Every day | ORAL | Status: DC
  Administered 2021-12-03 – 2021-12-04 (×2): 400 mg via ORAL
  Filled 2021-12-03 (×2): qty 1

## 2021-12-03 MED ORDER — SUGAMMADEX SODIUM 200 MG/2ML IV SOLN
INTRAVENOUS | Status: DC | PRN
  Administered 2021-12-03: 100 mg via INTRAVENOUS

## 2021-12-03 MED ORDER — ACETAMINOPHEN 500 MG PO TABS
1000.0000 mg | ORAL_TABLET | Freq: Four times a day (QID) | ORAL | Status: AC
  Administered 2021-12-03 – 2021-12-04 (×4): 1000 mg via ORAL
  Filled 2021-12-03 (×4): qty 2

## 2021-12-03 MED ORDER — PROPOFOL 10 MG/ML IV BOLUS
INTRAVENOUS | Status: DC | PRN
  Administered 2021-12-03: 150 mg via INTRAVENOUS

## 2021-12-03 MED ORDER — ONDANSETRON HCL 4 MG/2ML IJ SOLN
INTRAMUSCULAR | Status: AC
  Filled 2021-12-03: qty 2

## 2021-12-03 MED ORDER — 0.9 % SODIUM CHLORIDE (POUR BTL) OPTIME
TOPICAL | Status: DC | PRN
  Administered 2021-12-03: 500 mL

## 2021-12-03 MED ORDER — LIDOCAINE HCL (PF) 2 % IJ SOLN
INTRAMUSCULAR | Status: AC
  Filled 2021-12-03: qty 5

## 2021-12-03 MED ORDER — HYDROMORPHONE HCL 1 MG/ML IJ SOLN: 0.5000 mg | INTRAMUSCULAR | Status: DC | PRN

## 2021-12-03 MED ORDER — MIDAZOLAM HCL 2 MG/2ML IJ SOLN
INTRAMUSCULAR | Status: DC | PRN
  Administered 2021-12-03 (×2): 1 mg via INTRAVENOUS

## 2021-12-03 MED ORDER — SODIUM CHLORIDE 0.9 % IR SOLN
Status: DC | PRN
  Administered 2021-12-03: 3000 mL

## 2021-12-03 MED ORDER — CEFAZOLIN SODIUM-DEXTROSE 2-4 GM/100ML-% IV SOLN
INTRAVENOUS | Status: AC
  Filled 2021-12-03: qty 100

## 2021-12-03 MED ORDER — ONDANSETRON HCL 4 MG/2ML IJ SOLN: 4.0000 mg | Freq: Four times a day (QID) | INTRAMUSCULAR | Status: DC | PRN

## 2021-12-03 MED ORDER — OXYCODONE HCL 5 MG PO TABS
5.0000 mg | ORAL_TABLET | ORAL | Status: DC | PRN
  Administered 2021-12-03 – 2021-12-04 (×6): 10 mg via ORAL
  Filled 2021-12-03 (×6): qty 2

## 2021-12-03 MED ORDER — METOCLOPRAMIDE HCL 5 MG PO TABS: 5.0000 mg | ORAL_TABLET | Freq: Three times a day (TID) | ORAL | Status: DC | PRN

## 2021-12-03 MED ORDER — OXYCODONE HCL 5 MG/5ML PO SOLN: 5.0000 mg | Freq: Once | ORAL | Status: DC | PRN

## 2021-12-03 SURGICAL SUPPLY — 60 items
BIT DRILL QUICK REL 1/8 2PK SL (DRILL) IMPLANT
BLADE SAW SAG 25X90X1.19 (BLADE) ×1 IMPLANT
BLADE SURG SZ20 CARB STEEL (BLADE) ×1 IMPLANT
BNDG ELASTIC 6X5.8 VLCR NS LF (GAUZE/BANDAGES/DRESSINGS) ×1 IMPLANT
CEMENT BONE R 1X40 (Cement) ×2 IMPLANT
CEMENT VACUUM MIXING SYSTEM (MISCELLANEOUS) ×1 IMPLANT
CHLORAPREP W/TINT 26 (MISCELLANEOUS) ×1 IMPLANT
COMP PAT 3 PEG SERIES A 31/6.2 (Miscellaneous) ×1 IMPLANT
COMPONENT PAT3 PEG SERS 31/6.2 (Miscellaneous) IMPLANT
COOLER POLAR GLACIER W/PUMP (MISCELLANEOUS) ×1 IMPLANT
COVER MAYO STAND REUSABLE (DRAPES) ×1 IMPLANT
CUFF TOURN SGL QUICK 24 (TOURNIQUET CUFF)
CUFF TOURN SGL QUICK 34 (TOURNIQUET CUFF)
CUFF TRNQT CYL 24X4X16.5-23 (TOURNIQUET CUFF) IMPLANT
CUFF TRNQT CYL 34X4.125X (TOURNIQUET CUFF) IMPLANT
DRAPE 3/4 80X56 (DRAPES) ×1 IMPLANT
DRAPE IMP U-DRAPE 54X76 (DRAPES) ×1 IMPLANT
DRAPE U-SHAPE 47X51 STRL (DRAPES) ×1 IMPLANT
DRILL QUICK RELEASE 1/8 INCH (DRILL) ×3
DRSG MEPILEX SACRM 8.7X9.8 (GAUZE/BANDAGES/DRESSINGS) IMPLANT
DRSG OPSITE POSTOP 4X10 (GAUZE/BANDAGES/DRESSINGS) ×1 IMPLANT
DRSG OPSITE POSTOP 4X8 (GAUZE/BANDAGES/DRESSINGS) ×1 IMPLANT
ELECT REM PT RETURN 9FT ADLT (ELECTROSURGICAL) ×1
ELECTRODE REM PT RTRN 9FT ADLT (ELECTROSURGICAL) ×1 IMPLANT
GAUZE XEROFORM 1X8 LF (GAUZE/BANDAGES/DRESSINGS) ×1 IMPLANT
GLOVE BIO SURGEON STRL SZ7.5 (GLOVE) ×4 IMPLANT
GLOVE BIO SURGEON STRL SZ8 (GLOVE) ×4 IMPLANT
GLOVE BIOGEL PI IND STRL 8 (GLOVE) ×1 IMPLANT
GLOVE SURG UNDER LTX SZ8 (GLOVE) ×1 IMPLANT
GOWN STRL REUS W/ TWL LRG LVL3 (GOWN DISPOSABLE) ×1 IMPLANT
GOWN STRL REUS W/ TWL XL LVL3 (GOWN DISPOSABLE) ×1 IMPLANT
GOWN STRL REUS W/TWL LRG LVL3 (GOWN DISPOSABLE) ×1
GOWN STRL REUS W/TWL XL LVL3 (GOWN DISPOSABLE) ×1
HOOD PEEL AWAY FLYTE STAYCOOL (MISCELLANEOUS) ×3 IMPLANT
INSERT TIB BEARING 71 10 (Insert) IMPLANT
IV NS IRRIG 3000ML ARTHROMATIC (IV SOLUTION) ×1 IMPLANT
KIT TURNOVER KIT A (KITS) ×1 IMPLANT
KNEE CR FEMORAL RT 65MM (Femur) IMPLANT
MANIFOLD NEPTUNE II (INSTRUMENTS) ×1 IMPLANT
NDL SPNL 20GX3.5 QUINCKE YW (NEEDLE) ×1 IMPLANT
NEEDLE SPNL 20GX3.5 QUINCKE YW (NEEDLE) ×1 IMPLANT
NS IRRIG 1000ML POUR BTL (IV SOLUTION) ×1 IMPLANT
PACK TOTAL KNEE (MISCELLANEOUS) ×1 IMPLANT
PAD WRAPON POLAR KNEE (MISCELLANEOUS) ×1 IMPLANT
PENCIL SMOKE EVACUATOR (MISCELLANEOUS) ×1 IMPLANT
PLATE KNEE TIBIAL 71MM FIXED (Plate) IMPLANT
PULSAVAC PLUS IRRIG FAN TIP (DISPOSABLE) ×1
STAPLER SKIN PROX 35W (STAPLE) ×1 IMPLANT
SUCTION FRAZIER HANDLE 10FR (MISCELLANEOUS) ×1
SUCTION TUBE FRAZIER 10FR DISP (MISCELLANEOUS) ×1 IMPLANT
SUT VIC AB 0 CT1 36 (SUTURE) ×3 IMPLANT
SUT VIC AB 2-0 CT1 27 (SUTURE) ×4
SUT VIC AB 2-0 CT1 TAPERPNT 27 (SUTURE) ×3 IMPLANT
SYR 10ML LL (SYRINGE) ×1 IMPLANT
SYR 20ML LL LF (SYRINGE) ×1 IMPLANT
SYR 30ML LL (SYRINGE) IMPLANT
TIP FAN IRRIG PULSAVAC PLUS (DISPOSABLE) ×1 IMPLANT
TRAP FLUID SMOKE EVACUATOR (MISCELLANEOUS) ×2 IMPLANT
WATER STERILE IRR 500ML POUR (IV SOLUTION) ×1 IMPLANT
WRAPON POLAR PAD KNEE (MISCELLANEOUS) ×1

## 2021-12-03 NOTE — Progress Notes (Signed)
Spoke to the patient She will be going to her son's at DC in [place], [place] is set up for [facility] and they are aware that she is going to [place] She needs a rolling walker and a 3 in 1, [employer] to deliver to the room prior to DC Her son will provide transportation

## 2021-12-03 NOTE — H&P (Signed)
History of Present Illness: Kathy Burnett is a 56 y.o.female who is being seen for history and physical for an upcoming right total knee arthroplasty to be done by Dr. Roland Rack on December 03, 2021. The symptoms began about 6 to 7 months ago and developed without any specific cause or injury . She was sent for an MRI scan and followed up with Dr. Posey Pronto for possible arthroscopic procedure. However, he felt that her degenerative changes were too advanced and suggested that she discuss arthroplasty options with me. She reports 10/10 pain. The pain is located along the anterior and lateral aspects of the knee. The pain is described as aching, dull, stabbing, and throbbing. The symptoms are aggravated with normal daily activities, with sleeping, using stairs, at higher levels of activity, rising from a chair, walking, standing, standing pivot, and activity in general. She also describes an intermittent sharp painful catching in her knee. She has associated mild swelling and deformity. She has tried over-the-counter medications, anti-inflammatories, narcotic medications, steroid injections, a home exercise program, ice, and heat with limited benefit. She denies any prior problems with her knee, and denies any numbness or paresthesias down her leg to her foot.  Current Outpatient Medications: citalopram (CELEXA) 40 MG tablet Take 40 mg by mouth once daily.  lisinopriL (ZESTRIL) 10 MG tablet  traMADoL (ULTRAM) 50 mg tablet Take 1 tablet (50 mg total) by mouth every 6 (six) hours as needed for Pain for up to 30 doses 30 tablet 0  atorvastatin (LIPITOR) 20 MG tablet Take 20 mg by mouth once daily.   Allergies: No Known Allergies  Past Medical History:  Anxiety  Diabetes mellitus without complication (CMS-HCC)  Hypercholesteremia 2012  Hypertension 2012  Mixed hyperlipidemia 12/10/2020  Pre-diabetes 12/10/2020  Stage 2 chronic kidney disease 2010   Past Surgical History:  CESAREAN SECTION 1992  BREAST  EXCISIONAL BIOPSY Right 03/15/2010  COLONOSCOPY 07/12/2014 (Entire examined colon is normal/Repeat 89yrs/PYO)   Past Family History: History reviewed. No pertinent family history.   Social History:   Socioeconomic History:  Marital status: Widowed  Tobacco Use  Smoking status: Never  Smokeless tobacco: Never   Review of Systems:  A comprehensive 14 point ROS was performed, reviewed, and the pertinent orthopaedic findings are documented in the HPI.  Physical Exam: Vitals:  12/01/21 1354  BP: 137/76  Pulse: 83  Weight: 95.7 kg (211 lb)  Height: 165.1 cm (5\' 5" )  PainSc: 10-Worst pain ever  PainLoc: Knee   General/Constitutional: The patient appears to be well-nourished, well-developed, and in no acute distress. Neuro/Psych: Normal mood and affect, oriented to person, place and time. Eyes: Non-icteric. Pupils are equal, round, and reactive to light, and exhibit synchronous movement. Lymphatic: No palpable adenopathy. Respiratory: Lungs clear to auscultation, Normal chest excursion, No wheezes, and Non-labored breathing Cardiovascular: Regular rate and rhythm. No murmurs. and No edema, swelling or tenderness, except as noted in detailed exam. Vascular: No edema, swelling or tenderness, except as noted in detailed exam. Integumentary: No impressive skin lesions present, except as noted in detailed exam. Musculoskeletal: Unremarkable, except as noted in detailed exam.  Heart: Examination of the heart reveals regular, rate, and rhythm. There is no murmur noted on ascultation. There is a normal apical pulse.  Lungs: Lungs are clear to auscultation. There is no wheeze, rhonchi, or crackles. There is normal expansion of bilateral chest walls.   Right knee exam: GAIT: antalgic gait and uses no assistive devices. ALIGNMENT: mild valgus SKIN: unremarkable SWELLING: mild EFFUSION: trace WARMTH:  no warmth TENDERNESS: moderate tenderness over the lateral joint line, but no medial  joint line tenderness ROM: 5 to 110 degrees with pain in maximal flexion McMURRAY'S: equivocal PATELLOFEMORAL: normal tracking with no peri-patellar tenderness and negative apprehension sign CREPITUS: Mild patellofemoral crepitance LACHMAN'S: negative PIVOT SHIFT: negative ANTERIOR DRAWER: negative POSTERIOR DRAWER: negative VARUS/VALGUS: Mildly positive pseudolaxity to valgus stressing  She is neurovascularly intact to the right lower extremity and foot.  Knee Imaging: Recent AP weightbearing of both knees, as well as lateral and merchant views of the right knee are available for review and have been reviewed by myself. These films demonstrate moderate degenerative changes, primarily involving the lateral compartment with 30% lateral joint space narrowing. Similar degenerative changes of the patellofemoral compartment also are noted. Overall alignment is minimal valgus. No fractures, lytic lesions, or abnormal calcifications are noted.  Knee Imaging, external: Right knee: A recent MRI scan of the right knee is available for review and has been reviewed by myself. This study demonstrates significant complex tearing of the lateral meniscus with lateral extrusion of the meniscus. Moderate to severe degenerative changes of the lateral compartment also are noted. Lesser degenerative changes of the patellofemoral more so than the medial compartment are noted as well. There is no significant ligamentous pathology. There is moderate bone marrow edema involving the lateral portion of the lateral tibial plateau without evidence for fractures.  Assessment:  1. Primary osteoarthritis of right knee. 2. Obesity (BMI 30.0-34.9), unspecified.   Plan: The treatment options were discussed with the patient. In addition, patient educational materials were provided regarding the diagnosis and treatment options. The patient is quite frustrated by her symptoms and functional limitations, and is ready to consider  more aggressive treatment options. Therefore, I have recommended a surgical procedure, specifically a right total knee arthroplasty. The procedure was discussed with the patient, as were the potential risks (including bleeding, infection, nerve and/or blood vessel injury, persistent or recurrent pain, loosening and/or failure of the components, dislocation, need for further surgery, blood clots, strokes, heart attacks and/or arhythmias, pneumonia, etc.) and benefits. The patient states his/her understanding and wishes to proceed. All of the patient's questions and concerns were answered. She can call any time with further concerns. She will return to work without restrictions. She will follow up post-surgery, routine.    H&P reviewed and patient re-examined. No changes.

## 2021-12-03 NOTE — Plan of Care (Signed)
  Problem: Education: Goal: Knowledge of General Education information will improve Description: Including pain rating scale, medication(s)/side effects and non-pharmacologic comfort measures Outcome: Progressing   Problem: Health Behavior/Discharge Planning: Goal: Ability to manage health-related needs will improve Outcome: Progressing   Problem: Clinical Measurements: Goal: Ability to maintain clinical measurements within normal limits will improve Outcome: Progressing   Problem: Clinical Measurements: Goal: Will remain free from infection Outcome: Progressing   Problem: Clinical Measurements: Goal: Respiratory complications will improve Outcome: Progressing   Problem: Nutrition: Goal: Adequate nutrition will be maintained Outcome: Progressing   Problem: Coping: Goal: Level of anxiety will decrease Outcome: Progressing   Problem: Pain Managment: Goal: General experience of comfort will improve Outcome: Progressing   Problem: Safety: Goal: Ability to remain free from injury will improve Outcome: Progressing   Problem: Skin Integrity: Goal: Risk for impaired skin integrity will decrease Outcome: Progressing

## 2021-12-03 NOTE — Anesthesia Postprocedure Evaluation (Signed)
Anesthesia Post Note  Patient: Kathy Burnett  Procedure(s) Performed: TOTAL KNEE ARTHROPLASTY (Right: Knee)  Patient location during evaluation: PACU Anesthesia Type: Regional Level of consciousness: awake and alert Pain management: pain level controlled Vital Signs Assessment: post-procedure vital signs reviewed and stable Respiratory status: spontaneous breathing, nonlabored ventilation, respiratory function stable and patient connected to nasal cannula oxygen Cardiovascular status: blood pressure returned to baseline and stable Postop Assessment: no apparent nausea or vomiting Anesthetic complications: no   No notable events documented.   Last Vitals:  Vitals:   12/03/21 1230 12/03/21 1304  BP: 119/67 133/75  Pulse: 90 100  Resp: 13 17  Temp: 36.7 C 36.5 C  SpO2: 94% 95%    Last Pain:  Vitals:   12/03/21 1230  PainSc: 0-No pain                 Ilene Qua

## 2021-12-03 NOTE — Anesthesia Procedure Notes (Signed)
Procedure Name: Intubation Date/Time: 12/03/2021 7:34 AM  Performed by: Loletha Grayer, CRNAPre-anesthesia Checklist: Patient identified, Emergency Drugs available, Suction available, Patient being monitored and Timeout performed Patient Re-evaluated:Patient Re-evaluated prior to induction Oxygen Delivery Method: Circle system utilized Preoxygenation: Pre-oxygenation with 100% oxygen Induction Type: IV induction Ventilation: Oral airway inserted - appropriate to patient size Laryngoscope Size: McGraph and 3 Grade View: Grade II Tube type: Oral Tube size: 7.0 mm Number of attempts: 2 Airway Equipment and Method: Stylet Placement Confirmation: ETT inserted through vocal cords under direct vision, positive ETCO2, CO2 detector and breath sounds checked- equal and bilateral Secured at: 20 cm Tube secured with: Tape Dental Injury: Teeth and Oropharynx as per pre-operative assessment  Comments: Sabra Heck 2 x1 attempt. Difficult to move vallecula-stiff. Mcgrath 3 x1 attempt. Success. Atraumatic

## 2021-12-03 NOTE — Anesthesia Preprocedure Evaluation (Addendum)
Anesthesia Evaluation  Patient identified by MRN, date of birth, ID band Patient awake    Reviewed: Allergy & Precautions, NPO status , Patient's Chart, lab work & pertinent test results  History of Anesthesia Complications (+) history of anesthetic complications  Airway Mallampati: III  TM Distance: >3 FB Neck ROM: Full    Dental  (+) Teeth Intact   Pulmonary neg pulmonary ROS,    Pulmonary exam normal breath sounds clear to auscultation       Cardiovascular Exercise Tolerance: Good hypertension, Pt. on medications negative cardio ROS Normal cardiovascular exam Rhythm:Regular Rate:Normal     Neuro/Psych PSYCHIATRIC DISORDERS Anxiety negative neurological ROS     GI/Hepatic negative GI ROS, Neg liver ROS,   Endo/Other  diabetes  Renal/GU Renal InsufficiencyRenal disease  negative genitourinary   Musculoskeletal negative musculoskeletal ROS (+)   Abdominal   Peds negative pediatric ROS (+)  Hematology negative hematology ROS (+)   Anesthesia Other Findings   Reproductive/Obstetrics negative OB ROS                           Anesthesia Physical Anesthesia Plan  ASA: 2  Anesthesia Plan: General ETT and Regional   Post-op Pain Management: Dilaudid IV, Toradol IV (intra-op)* and Ofirmev IV (intra-op)*   Induction: Intravenous  PONV Risk Score and Plan: 3 and Ondansetron, Dexamethasone, Treatment may vary due to age or medical condition and Midazolam  Airway Management Planned: Oral ETT  Additional Equipment:   Intra-op Plan:   Post-operative Plan: Extubation in OR  Informed Consent: I have reviewed the patients History and Physical, chart, labs and discussed the procedure including the risks, benefits and alternatives for the proposed anesthesia with the patient or authorized representative who has indicated his/her understanding and acceptance.     Dental Advisory  Given  Plan Discussed with: Anesthesiologist, CRNA and Surgeon  Anesthesia Plan Comments: (Patient consented for risks of anesthesia including but not limited to:  - adverse reactions to medications - damage to eyes, teeth, lips or other oral mucosa - nerve damage due to positioning  - sore throat or hoarseness - Damage to heart, brain, nerves, lungs, other parts of body or loss of life  Patient voiced understanding.  Patient consented for regional anesthesia. )      Anesthesia Quick Evaluation

## 2021-12-03 NOTE — Transfer of Care (Signed)
Immediate Anesthesia Transfer of Care Note  Patient: Parks Ranger  Procedure(s) Performed: TOTAL KNEE ARTHROPLASTY (Right: Knee)  Patient Location: PACU  Anesthesia Type:General  Level of Consciousness: awake and patient cooperative  Airway & Oxygen Therapy: Patient Spontanous Breathing  Post-op Assessment: Report given to RN and Post -op Vital signs reviewed and stable  Post vital signs: Reviewed and stable  Last Vitals:  Vitals Value Taken Time  BP 124/77 12/03/21 1002  Temp 36.8 C 12/03/21 0955  Pulse 109 12/03/21 1002  Resp 17 12/03/21 1002  SpO2 92 % 12/03/21 1002    Last Pain:  Vitals:   12/03/21 0629  PainSc: 10-Worst pain ever         Complications: No notable events documented.

## 2021-12-03 NOTE — Anesthesia Procedure Notes (Signed)
Anesthesia Regional Block: Adductor canal block   Pre-Anesthetic Checklist: , timeout performed,  Correct Patient, Correct Site, Correct Laterality,  Correct Procedure, Correct Position, site marked,  Risks and benefits discussed,  Surgical consent,  Pre-op evaluation,  At surgeon's request and post-op pain management  Laterality: Lower and Right  Prep: alcohol swabs       Needles:  Injection technique: Single-shot  Needle Type: Echogenic Needle     Needle Length: 9cm  Needle Gauge: 21     Additional Needles:   Procedures:,,,, ultrasound used (permanent image in chart),,    Narrative:  Start time: 12/03/2021 10:21 AM End time: 12/03/2021 10:24 AM Injection made incrementally with aspirations every 5 mL.  Performed by: Personally  Anesthesiologist: Ilene Qua, MD  Additional Notes: Patient's chart reviewed and they were deemed appropriate candidate for procedure, per surgeon's request. Patient educated about risks, benefits, and alternatives of the block including but not limited to: temporary or permanent nerve damage, bleeding, infection, damage to surround tissues, block failure, local anesthetic toxicity. Patient expressed understanding. A formal time-out was conducted consistent with institution rules.  Monitors were applied, and minimal sedation used (see nursing record). The site was prepped with skin prep and allowed to dry, and sterile gloves were used. A high frequency linear ultrasound probe with probe cover was utilized throughout. Femoral artery visualized at mid-thigh level, local anesthetic injected anterolateral to it, and echogenic block needle trajectory was monitored throughout. Hydrodissection of saphenous nerve visualized and appeared anatomically normal. Aspiration performed every 61ml. Blood vessels were avoided. All injections were performed without resistance and free of blood and paresthesias. The patient tolerated the procedure well.  Injectate: 10cc  0.25% bupi

## 2021-12-03 NOTE — Op Note (Signed)
12/03/2021  9:44 AM  Patient:   Kathy Burnett  Pre-Op Diagnosis:   Degenerative joint disease, right knee.  Post-Op Diagnosis:   Same  Procedure:   Right TKA using all-cemented [employer] Vanguard system with a 65 mm PCR femur, a 71 mm tibial tray with a 10 mm anterior stabilized E-poly insert, and a 31 x 6.2 mm all-poly 3-pegged domed patella.  Surgeon:   Pascal Lux, MD  Assistant:   Cameron Proud, PA-C   Anesthesia:   GET  Findings:   As above  Complications:   None  EBL:   5 cc  Fluids:   500 cc crystalloid  UOP:   None  TT:   87 minutes at 300 mmHg  Drains:   None  Closure:   Staples  Implants:   As above  Brief Clinical Note:   The patient is a 56 year old female with a long history of progressively worsening right knee pain. The patient's symptoms have progressed despite medications, activity modification, injections, etc. The patient's history and examination were consistent with advanced degenerative joint disease of the knee knee confirmed by plain radiographs. The patient presents at this time for a right total knee arthroplasty.  Procedure:   The patient was brought into the operating room and lain in the supine position.  After adequate general endotracheal intubation and anesthesia were obtained, the right lower extremity was prepped with ChloraPrep solution and draped sterilely. Preoperative antibiotics were administered. A timeout was performed to verify the appropriate surgical site before the limb was exsanguinated with an Esmarch and the tourniquet inflated to 300 mmHg.   A standard anterior approach to the knee was made through an approximately 6-7 inch incision. The incision was carried down through the subcutaneous tissues to expose superficial retinaculum. This was split the length of the incision and the medial flap elevated sufficiently to expose the medial retinaculum. The medial retinaculum was incised, leaving a 3-4 mm cuff of tissue on the  patella. This was extended distally along the medial border of the patellar tendon and proximally through the medial third of the quadriceps tendon. A subtotal fat pad excision was performed before the soft tissues were elevated off the anteromedial and anterolateral aspects of the proximal tibia to the level of the collateral ligaments. The anterior portions of the medial and lateral menisci were removed, as was the anterior cruciate ligament. With the knee flexed to 90, the external tibial guide was positioned and the appropriate proximal tibial cut made. This piece was taken to the back table where it was measured and found to be optimally replicated by a 71 mm component.  Attention was directed to the distal femur. The intramedullary canal was accessed through a 3/8" drill hole. The intramedullary guide was inserted and positioned in order to obtain a neutral flexion gap. The intercondylar block was positioned with care taken to avoid notching the anterior cortex of the femur. The appropriate cut was made. Next, the distal cutting block was placed at 6 of valgus alignment. Using the 9 mm slot, the distal cut was made. The distal femur was measured and found to be optimally replicated by the 65 mm component. The 65 mm 4-in-1 cutting block was positioned and first the posterior, then the posterior chamfer, the anterior chamfer, and finally the anterior cuts were made. At this point, the posterior portions medial and lateral menisci were removed. A trial reduction was performed using the appropriate femoral and tibial components with the 10 mm insert.  This demonstrated excellent stability to varus and valgus stressing both in flexion and extension while permitting full extension. Patella tracking was assessed and found to be excellent. Therefore, the tibial guide position was marked on the proximal tibia. The patella thickness was measured and found to be 21 mm. Therefore, the appropriate cut was made. The  patellar surface was measured and found to be optimally replicated by the 31 mm component. The three peg holes were drilled in place before the trial button was inserted. Patella tracking was assessed and found to be excellent, passing the "no thumb test". The lug holes were drilled into the distal femur before the trial component was removed, leaving only the tibial tray. The keel was then created using the appropriate tower, reamer, and punch.  The bony surfaces were prepared for cementing by irrigating them thoroughly with sterile saline solution via the jet lavage system. A bone plug was fashioned from some of the bone that had been removed previously and used to plug the distal femoral canal. In addition, 20 cc of Exparel diluted out to 60 cc with normal saline and 30 cc of 0.5% Sensorcaine were injected into the postero-medial and postero-lateral aspects of the knee, the medial and lateral gutter regions, and the peri-incisional tissues to help with postoperative analgesia. Meanwhile, the cement was being mixed on the back table. When it was ready, the tibial tray was cemented in first. The excess cement was removed using [employer] fast streamer. Next, the femoral component was impacted into place. Again, the excess cement was removed using [employer] fast streamer. The 10 mm trial insert was positioned and the knee brought into extension while the cement hardened. Finally, the patella was cemented into place and secured using the patellar clamp. Again, the excess cement was removed using [employer] fast streamer. Once the cement had hardened, the knee was placed through a range of motion with the findings as described above. Therefore, the trial insert was removed and, after verifying that no cement had been retained posteriorly, the permanent 10 mm anterior stabilized E-polyethylene insert was positioned and secured using the appropriate key locking mechanism. Again the knee was placed through a range of motion with the findings as  described above.  The wound was copiously irrigated with sterile saline solution using the jet lavage system before the quadriceps tendon and retinacular layer were reapproximated using #0 Vicryl interrupted sutures. The superficial retinacular layer also was closed using a running #0 Vicryl suture. A total of 10 cc of transexemic acid (TXA) was injected intra-articularly before the subcutaneous tissues were closed in several layers using 2-0 Vicryl interrupted sutures. The skin was closed using staples. A sterile honeycomb dressing was applied to the skin before the leg was wrapped with an Ace wrap to accommodate the Polar Care device. The patient was then awakened, extubated, and returned to the recovery room in satisfactory condition after tolerating the procedure well.

## 2021-12-03 NOTE — Evaluation (Signed)
Physical Therapy Evaluation Patient Details Name: Kathy Burnett MRN: [id number] DOB: Jun 17, 1965 Today's Date: 12/03/2021  History of Present Illness  Pt is 64 YOF admitted for R TKA. PMH includes OA, obesity, DM2, HLD, HTN, CKD2.  Clinical Impression  Pt presents to PT in bed and agreeable to participate in therapy services. Pt was pleasant and motivated to participate during the session and put forth good effort throughout. Able to amb to recliner w no physical assistance required and no LOB. Required some cueing for hand placement and sequencing. Would benefit from skilled HHPT to address above deficits in strength, ROM, functional mobility, activity tolerance, and balance and promote optimal return to PLOF.        Recommendations for follow up therapy are one component of a multi-disciplinary discharge planning process, led by the attending physician.  Recommendations may be updated based on patient status, additional functional criteria and insurance authorization.  Follow Up Recommendations Home health PT      Assistance Recommended at Discharge Intermittent Supervision/Assistance  Patient can return home with the following  A little help with walking and/or transfers;A little help with bathing/dressing/bathroom;Assistance with cooking/housework;Assist for transportation;Help with stairs or ramp for entrance    Equipment Recommendations Rolling walker (2 wheels);BSC/3in1  Recommendations for Other Services       Functional Status Assessment Patient has had a recent decline in their functional status and demonstrates the ability to make significant improvements in function in a reasonable and predictable amount of time.     Precautions / Restrictions Precautions Precautions: Knee;Fall Precaution Booklet Issued: Yes (comment) Restrictions Weight Bearing Restrictions: Yes RLE Weight Bearing: Weight bearing as tolerated      Mobility  Bed Mobility Overal bed mobility:  Needs Assistance Bed Mobility: Supine to Sit     Supine to sit: Supervision     General bed mobility comments: extra time and effort    Transfers Overall transfer level: Needs assistance Equipment used: Rolling walker (2 wheels) Transfers: Sit to/from Stand Sit to Stand: Min guard, cueing for foot placement                Ambulation/Gait Ambulation/Gait assistance: Min guard Gait Distance (Feet): 12 Feet Assistive device: Rolling walker (2 wheels) Gait Pattern/deviations: Antalgic, Step-to pattern, Decreased stance time - right, Decreased step length - left, Decreased weight shift to right Gait velocity: decr     General Gait Details: incr time and effort, some cueing for sequencing and hand placement  Stairs            Wheelchair Mobility    Modified Rankin (Stroke Patients Only)       Balance Overall balance assessment: Needs assistance Sitting-balance support: Feet unsupported, Bilateral upper extremity supported Sitting balance-Leahy Scale: Good     Standing balance support: Bilateral upper extremity supported, During functional activity Standing balance-Leahy Scale: Fair                               Pertinent Vitals/Pain Pain Assessment Pain Assessment: 0-10 Pain Score: 8  Pain Location: R knee, pain w mobility Pain Descriptors / Indicators: Discomfort, Aching Pain Intervention(s): Limited activity within patient's tolerance, Monitored during session, Premedicated before session    Home Living Family/patient expects to be discharged to:: Private residence Living Arrangements: Alone;Children (will be residing w son following hospital d/c)   Type of Home: House Home Access: Level entry     Alternate Level Stairs-Number of Steps: 15, R  handrail Home Layout: Two level Home Equipment: None   Able to sleep on 1st floor w half bath if necessary   Prior Function Prior Level of Function : Independent/Modified Independent              Mobility Comments: full comm amb w no AD ADLs Comments: I w ADLs/IADLs     Hand Dominance        Extremity/Trunk Assessment   Upper Extremity Assessment Upper Extremity Assessment: Overall WFL for tasks assessed    Lower Extremity Assessment Lower Extremity Assessment: RLE deficits/detail RLE Deficits / Details: BLE ankle strength, AROM, sensation to light tough grossly intact. RLE strength assessment limited by pain.      Communication   Communication: No difficulties  Cognition Arousal/Alertness: Awake/alert Behavior During Therapy: WFL for tasks assessed/performed Overall Cognitive Status: Within Functional Limits for tasks assessed                                          General Comments      Exercises Total Joint Exercises Ankle Circles/Pumps: AROM, Both, 10 reps, Supine Quad Sets: Strengthening, Both, 10 reps, Supine Long Arc Quad: Strengthening, Both, 10 reps Knee Flexion: AROM, Right, 10 reps Goniometric ROM: R knee AROM 4-86 Marching in Standing: Strengthening, Both, 10 reps Other Exercises Other Exercises: step to gait pattern w RW edu   Assessment/Plan    PT Assessment Patient needs continued PT services  PT Problem List Decreased strength;Decreased range of motion;Decreased activity tolerance;Decreased balance;Decreased mobility;Decreased knowledge of use of DME;Pain       PT Treatment Interventions DME instruction;Functional mobility training;Balance training;Patient/family education;Gait training;Therapeutic activities;Neuromuscular re-education;Stair training;Therapeutic exercise    PT Goals (Current goals can be found in the Care Plan section)  Acute Rehab PT Goals Patient Stated Goal: to walk without pain PT Goal Formulation: With patient Time For Goal Achievement: 12/16/21 Potential to Achieve Goals: Good    Frequency BID     Co-evaluation               AM-PAC PT "6 Clicks" Mobility  Outcome Measure  Help needed turning from your back to your side while in a flat bed without using bedrails?: None Help needed moving from lying on your back to sitting on the side of a flat bed without using bedrails?: None Help needed moving to and from a bed to a chair (including a wheelchair)?: A Little Help needed standing up from a chair using your arms (e.g., wheelchair or bedside chair)?: A Little Help needed to walk in hospital room?: A Little Help needed climbing 3-5 steps with a railing? : A Lot 6 Click Score: 19    End of Session Equipment Utilized During Treatment: Gait belt Activity Tolerance: Patient tolerated treatment well Patient left: in chair;with call bell/phone within reach;with SCD's reapplied;Other (comment);with chair alarm set (no call bell in room, nursing notified), polar care reapplied  Nurse Communication: Mobility status (need for call bell in pt's room) PT Visit Diagnosis: Muscle weakness (generalized) (M62.81);Other abnormalities of gait and mobility (R26.89);Pain Pain - Right/Left: Right Pain - part of body: Knee    Time: 6144-3154 PT Time Calculation (min) (ACUTE ONLY): 50 min   Charges:              Odette Horns MPH, SPT 12/03/21, 3:17 PM

## 2021-12-04 ENCOUNTER — Encounter: Payer: Self-pay | Admitting: Surgery

## 2021-12-04 DIAGNOSIS — M1711 Unilateral primary osteoarthritis, right knee: Secondary | ICD-10-CM | POA: Diagnosis not present

## 2021-12-04 MED ORDER — ACETAMINOPHEN 500 MG PO TABS: 500.0000 mg | ORAL_TABLET | Freq: Four times a day (QID) | ORAL | 0 refills | Status: DC | PRN

## 2021-12-04 MED ORDER — APIXABAN 2.5 MG PO TABS: 2.5000 mg | ORAL_TABLET | Freq: Two times a day (BID) | ORAL | 0 refills | Status: DC

## 2021-12-04 MED ORDER — CELECOXIB 100 MG PO CAPS: 100.0000 mg | ORAL_CAPSULE | Freq: Two times a day (BID) | ORAL | 0 refills | Status: DC

## 2021-12-04 MED ORDER — OXYCODONE HCL 5 MG PO TABS: 5.0000 mg | ORAL_TABLET | ORAL | 0 refills | Status: DC | PRN

## 2021-12-04 MED ORDER — ONDANSETRON HCL 4 MG PO TABS: 4.0000 mg | ORAL_TABLET | Freq: Four times a day (QID) | ORAL | 0 refills | Status: DC | PRN

## 2021-12-04 NOTE — Progress Notes (Signed)
Patient is not able to walk the distance required to go the bathroom, or he/she is unable to safely negotiate stairs required to access the bathroom.  A 3in1 BSC will alleviate this problem  

## 2021-12-04 NOTE — Progress Notes (Signed)
Physical Therapy Treatment Patient Details Name: Kathy Burnett MRN: [id number] DOB: 14-Sep-1965 Today's Date: 12/04/2021   History of Present Illness Pt is 36 YOF admitted for R TKA. PMH includes OA, obesity, HTN, DM2, CKD2.    PT Comments    Pt presents to PT in recliner and agreeable to participate in therapy services.  Pt was pleasant and motivated to participate during the session and put forth good effort throughout. Pt edu on stair negotiation w no handrails w RW provided. Pt verbalized and demonstrated understanding, required occasional cueing but no physical assistance required and no LOB. Pt's son present and involved in pt guarding technique edu. Would benefit from skilled HHPT to address above deficits in strength, ROM, functional mobility, balance, and gait mechanics to promote optimal return to PLOF.    Recommendations for follow up therapy are one component of a multi-disciplinary discharge planning process, led by the attending physician.  Recommendations may be updated based on patient status, additional functional criteria and insurance authorization.  Follow Up Recommendations  Home health PT     Assistance Recommended at Discharge Intermittent Supervision/Assistance  Patient can return home with the following A little help with walking and/or transfers;A little help with bathing/dressing/bathroom;Assistance with cooking/housework;Assist for transportation;Help with stairs or ramp for entrance   Equipment Recommendations  Rolling walker (2 wheels);BSC/3in1    Recommendations for Other Services       Precautions / Restrictions Precautions Precautions: Knee;Fall Precaution Booklet Issued: Yes (comment) Restrictions Weight Bearing Restrictions: Yes RLE Weight Bearing: Weight bearing as tolerated     Mobility  Bed Mobility               General bed mobility comments: NTpt in recliner pre/post session    Transfers Overall transfer level: Needs  assistance Equipment used: Rolling walker (2 wheels) Transfers: Sit to/from Stand Sit to Stand: Supervision                Ambulation/Gait Ambulation/Gait assistance: Min guard Gait Distance (Feet): 200 Feet Assistive device: Rolling walker (2 wheels) Gait Pattern/deviations: Antalgic, Step-to pattern, Decreased stance time - right, Decreased step length - left, Decreased weight shift to right, Step-through pattern Gait velocity: decr     General Gait Details: incr time and effort   Stairs Stairs: Yes Stairs assistance: Min guard Stair Management: No rails, Step to pattern, Backwards, With walker, and forwards Number of Stairs: 4 General stair comments: required no physical assistance, no LOB, up w good, down w surgical pattern   Wheelchair Mobility    Modified Rankin (Stroke Patients Only)       Balance Overall balance assessment: Needs assistance Sitting-balance support: Feet unsupported, Bilateral upper extremity supported Sitting balance-Leahy Scale: Good     Standing balance support: Bilateral upper extremity supported, During functional activity Standing balance-Leahy Scale: Good                              Cognition Arousal/Alertness: Awake/alert Behavior During Therapy: WFL for tasks assessed/performed Overall Cognitive Status: Within Functional Limits for tasks assessed                                          Exercises Positioning edu to encourage R knee extension, PROM    General Comments        Pertinent Vitals/Pain Pain Assessment Pain Assessment: 0-10 Pain  Score: 6  Pain Location: R knee Pain Descriptors / Indicators: Aching, Discomfort, Grimacing Pain Intervention(s): Limited activity within patient's tolerance, Monitored during session, Premedicated before session, Repositioned    Home Living Family/patient expects to be discharged to:: Private residence Living Arrangements: Alone;Children (d/c home  w son) Available Help at Discharge: Available 24 hours/day;Family Type of Home: House Home Access: Level entry     Alternate Level Stairs-Number of Steps: 15 Home Layout: Two level Home Equipment: None      Prior Function            PT Goals (current goals can now be found in the care plan section) Acute Rehab PT Goals Patient Stated Goal: to walk without pain PT Goal Formulation: With patient Time For Goal Achievement: 12/16/21 Potential to Achieve Goals: Good Progress towards PT goals: Progressing toward goals    Frequency    BID      PT Plan Current plan remains appropriate    Co-evaluation              AM-PAC PT "6 Clicks" Mobility   Outcome Measure  Help needed turning from your back to your side while in a flat bed without using bedrails?: None Help needed moving from lying on your back to sitting on the side of a flat bed without using bedrails?: None Help needed moving to and from a bed to a chair (including a wheelchair)?: A Little Help needed standing up from a chair using your arms (e.g., wheelchair or bedside chair)?: A Little Help needed to walk in hospital room?: A Little Help needed climbing 3-5 steps with a railing? : A Little 6 Click Score: 20    End of Session Equipment Utilized During Treatment: Gait belt Activity Tolerance: Patient tolerated treatment well Patient left: in chair;with call bell/phone within reach;with family/visitor present Nurse Communication: Mobility status PT Visit Diagnosis: Muscle weakness (generalized) (M62.81);Other abnormalities of gait and mobility (R26.89);Pain Pain - Right/Left: Right Pain - part of body: Knee     Time: 0105-0135 PT Time Calculation (min) (ACUTE ONLY): 30 min  Charges:  $Gait Training: 23-37 mins $Therapeutic Activity: 8-22 mins                    Glenice Laine MPH, SPT 12/04/21, 1:51 PM

## 2021-12-04 NOTE — Discharge Instructions (Signed)

## 2021-12-04 NOTE — Progress Notes (Signed)
  Subjective: 1 Day Post-Op Procedure(s) (LRB): TOTAL KNEE ARTHROPLASTY (Right) Patient reports pain as 8 on 0-10 scale.   Patient is well but having increased pain.  Patient did extremely well with PT yesterday. Plan is to go Home after hospital stay.  Patient is going to live with son in [place] on discharge. Negative for chest pain and shortness of breath Fever: no Gastrointestinal:Negative for nausea and vomiting Patient is passing gas this morning.  Objective: Vital signs in last 24 hours: Temp:  [97.7 F (36.5 C)-98.5 F (36.9 C)] 98.5 F (36.9 C) (09/22 0005) Pulse Rate:  [85-109] 92 (09/22 0005) Resp:  [9-20] 20 (09/22 0005) BP: (83-154)/(54-97) 129/65 (09/22 0005) SpO2:  [86 %-96 %] 95 % (09/22 0005)  Intake/Output from previous day:  Intake/Output Summary (Last 24 hours) at 12/04/2021 0727 Last data filed at 12/03/2021 0929 Gross per 24 hour  Intake 800 ml  Output 5 ml  Net 795 ml    Intake/Output this shift: No intake/output data recorded.  Labs: No results for input(s): "HGB" in the last 72 hours. No results for input(s): "WBC", "RBC", "HCT", "PLT" in the last 72 hours. No results for input(s): "NA", "K", "CL", "CO2", "BUN", "CREATININE", "GLUCOSE", "CALCIUM" in the last 72 hours. No results for input(s): "LABPT", "INR" in the last 72 hours.   EXAM General - Patient is Alert, Appropriate, and Oriented Extremity - ABD soft Neurovascular intact Dorsiflexion/Plantar flexion intact Incision: dressing C/D/I No cellulitis present Dressing/Incision - clean, dry, no drainage noted to the right honeycomb dressing Motor Function - intact, moving foot and toes well on exam.  ABdomen soft with intact bowel sounds. Negative Homan's to bilateral lower extremities.  Past Medical History:  Diagnosis Date   Anxiety    Diabetes mellitus without complication ([place])    Hypercholesteremia    Hypertension    Vitamin D deficiency     Assessment/Plan: 1 Day Post-Op  Procedure(s) (LRB): TOTAL KNEE ARTHROPLASTY (Right) Principal Problem:   Status post total knee replacement using cement, right  Estimated body mass index is 36.06 kg/m as calculated from the following:   Height as of this encounter: 5\' 4"  (1.626 m).   Weight as of this encounter: 95.3 kg. Advance diet Up with therapy D/C IV fluids when tolerating po intake.  Vitals reviewed this AM. Patient is increased pain after therapy but doing well. Needs to clear stairs this morning. Passing gas without pain. Plan for discharge home this afternoon pending therapy.  DVT Prophylaxis -  Eliquis, SCDs Weight-Bearing as tolerated to right leg  J. Cameron Proud, PA-C [facility] Orthopaedic Surgery 12/04/2021, 7:27 AM

## 2021-12-04 NOTE — Discharge Summary (Signed)
Physician Discharge Summary  Patient ID: Kathy Burnett MRN: [id number] DOB/AGE: 1965/12/30 56 y.o.  Admit date: 12/03/2021 Discharge date: 12/04/2021  Admission Diagnoses:  Status post total knee replacement using cement, right [Z96.651] Right knee degenerative joint disease  Discharge Diagnoses: Patient Active Problem List   Diagnosis Date Noted   Status post total knee replacement using cement, right 12/03/2021   Pre-diabetes 12/10/2020   Mixed hyperlipidemia 12/10/2020   Stage 2 chronic kidney disease    Hypertension    Hypercholesteremia    Diabetes mellitus without complication ([place])    Anxiety     Past Medical History:  Diagnosis Date   Anxiety    Diabetes mellitus without complication ([place])    Hypercholesteremia    Hypertension    Vitamin D deficiency      Transfusion: None.   Consultants (if any):   Discharged Condition: Improved  Hospital Course: Kathy Burnett is an 56 y.o. female who was admitted 12/03/2021 with a diagnosis of right knee degenerative joint disease and went to the operating room on 12/03/2021 and underwent the above named procedures.    Surgeries: Procedure(s): TOTAL KNEE ARTHROPLASTY on 12/03/2021 Patient tolerated the surgery well. Taken to PACU where she was stabilized and then transferred to the orthopedic floor.  Started on Eliquis 2.5mg  every 12hrs. Heels elevated on bed with rolled towels. No evidence of DVT. Negative Homan. Physical therapy started on day #1 for gait training and transfer. OT started day #1 for ADL and assisted devices.  Patient's IV was removed on POD1  Implants: Right TKA using all-cemented [employer] Vanguard system with a 65 mm PCR femur, a 71 mm tibial tray with a 10 mm anterior stabilized E-poly insert, and a 31 x 6.2 mm all-poly 3-pegged domed patella.  She was given perioperative antibiotics:  Anti-infectives (From admission, onward)    Start     Dose/Rate Route Frequency Ordered Stop    12/03/21 1400  ceFAZolin (ANCEF) IVPB 2g/100 mL premix        2 g 200 mL/hr over 30 Minutes Intravenous Every 6 hours 12/03/21 1309 12/04/21 0156   12/03/21 0611  ceFAZolin (ANCEF) 2-4 GM/100ML-% IVPB       Note to Pharmacy: Rivka Spring L: cabinet override      12/03/21 0611 12/03/21 0758   12/03/21 0600  ceFAZolin (ANCEF) IVPB 2g/100 mL premix        2 g 200 mL/hr over 30 Minutes Intravenous On call to O.R. 12/03/21 5462 12/03/21 0755     .  She was given sequential compression devices, early ambulation, and Eliquis for DVT prophylaxis.  She benefited maximally from the hospital stay and there were no complications.    Recent vital signs:  Vitals:   12/04/21 0005 12/04/21 0853  BP: 129/65 (!) 116/50  Pulse: 92 84  Resp: 20 17  Temp: 98.5 F (36.9 C) 98.1 F (36.7 C)  SpO2: 95% 95%    Recent laboratory studies:  Lab Results  Component Value Date   HGB 13.8 11/25/2021   Lab Results  Component Value Date   WBC 5.8 11/25/2021   PLT 212 11/25/2021   No results found for: "INR" Lab Results  Component Value Date   NA 138 11/25/2021   K 4.0 11/25/2021   CL 105 11/25/2021   CO2 23 11/25/2021   BUN 20 11/25/2021   CREATININE 0.91 11/25/2021   GLUCOSE 109 (H) 11/25/2021    Discharge Medications:   Allergies as of 12/04/2021   No  Known Allergies      Medication List     STOP taking these medications    etodolac 500 MG tablet Commonly known as: LODINE   HYDROcodone-acetaminophen 5-325 MG tablet Commonly known as: NORCO/VICODIN   meloxicam 15 MG tablet Commonly known as: MOBIC   traMADol 50 MG tablet Commonly known as: ULTRAM   TYLENOL ARTHRITIS PAIN PO Replaced by: acetaminophen 500 MG tablet       TAKE these medications    acetaminophen 500 MG tablet Commonly known as: TYLENOL Take 1-2 tablets (500-1,000 mg total) by mouth every 6 (six) hours as needed. Replaces: TYLENOL ARTHRITIS PAIN PO   apixaban 2.5 MG Tabs tablet Commonly known as:  ELIQUIS Take 1 tablet (2.5 mg total) by mouth 2 (two) times daily.   atorvastatin 20 MG tablet Commonly known as: LIPITOR TAKE 1 TABLET(20 MG) BY MOUTH DAILY What changed:  how much to take how to take this when to take this   celecoxib 100 MG capsule Commonly known as: CeleBREX Take 1 capsule (100 mg total) by mouth 2 (two) times daily.   citalopram 40 MG tablet Commonly known as: CELEXA TAKE 1 TABLET(40 MG) BY MOUTH DAILY What changed:  how much to take how to take this when to take this   lisinopril 10 MG tablet Commonly known as: ZESTRIL Take 1 tablet (10 mg total) by mouth daily. What changed: when to take this   magnesium oxide 400 (240 Mg) MG tablet Commonly known as: MAG-OX Take 400 mg by mouth daily.   ondansetron 4 MG tablet Commonly known as: ZOFRAN Take 1 tablet (4 mg total) by mouth every 6 (six) hours as needed for nausea.   oxyCODONE 5 MG immediate release tablet Commonly known as: Oxy IR/ROXICODONE Take 1-2 tablets (5-10 mg total) by mouth every 4 (four) hours as needed for moderate pain (pain score 4-6).   promethazine 12.5 MG tablet Commonly known as: PHENERGAN Take by mouth.               Durable Medical Equipment  (From admission, onward)           Start     Ordered   12/03/21 1310  DME Bedside commode  Once       Question:  Patient needs a bedside commode to treat with the following condition  Answer:  Status post total knee replacement using cement, right   12/03/21 1309   12/03/21 1310  DME 3 n 1  Once        12/03/21 1309   12/03/21 1310  DME Walker rolling  Once       Question Answer Comment  Walker: With 5 Inch Wheels   Patient needs a walker to treat with the following condition Status post total knee replacement using cement, right      12/03/21 1309            Diagnostic Studies: DG Knee Right Port  Result Date: 12/03/2021 CLINICAL DATA:  Right knee replacement EXAM: PORTABLE RIGHT KNEE - 1-2 VIEW COMPARISON:   None Available. FINDINGS: Changes of right knee replacement. Soft tissue and joint space gas. No hardware bony complicating feature. IMPRESSION: Right knee replacement.  No visible complicating feature. Electronically Signed   By: Rolm Baptise M.D.   On: 12/03/2021 10:20   [place] OR NERVE BLOCK-IMAGE ONLY [facility] [place])  Result Date: 12/03/2021 There is no interpretation for this exam.  This order is for images obtained during a surgical procedure.  Please See "  Surgeries" Tab for more information regarding the procedure.    Disposition: Plan for discharge home today pending progress with PT.   Follow-up Information     Lattie Corns, PA-C Follow up in 14 day(s).   Specialty: Physician Assistant Why: Electa Sniff information: [place] [address] [phone]                Signed: Judson Roch PA-C 12/04/2021, 1:21 PM

## 2021-12-04 NOTE — Evaluation (Signed)
Occupational Therapy Evaluation Patient Details Name: Kathy Burnett MRN: [id number] DOB: 1965-09-02 Today's Date: 12/04/2021   History of Present Illness Pt is 61 YOF  s/p R TKA 12/03/21   Clinical Impression   Pt seen for OT evaluation this date, POD#1 from above surgery. PTA pt was MOD I in ADL/IADL, amb without AD.  Pt is eager to return to PLOF with less pain and improved safety and independence. Pt provided education re: in polar care mgt, falls prevention strategies, home/routines modifications, DME/AE for LB bathing and dressing tasks, and compression stocking mgt. ADL mobility completed with RW with supervision including toileting and toilet transfer, grooming standing at sink level. Anticipate MOD A for LB dressing. Pt would benefit from skilled OT services including additional instruction in dressing techniques with or without assistive devices for dressing and bathing skills to support recall and carryover prior to discharge and ultimately to maximize safety, independence, and minimize falls risk and caregiver burden. Do not currently anticipate any OT needs following this hospitalization.        Recommendations for follow up therapy are one component of a multi-disciplinary discharge planning process, led by the attending physician.  Recommendations may be updated based on patient status, additional functional criteria and insurance authorization.   Follow Up Recommendations  No OT follow up    Assistance Recommended at Discharge Intermittent Supervision/Assistance  Patient can return home with the following A little help with bathing/dressing/bathroom    Functional Status Assessment  Patient has had a recent decline in their functional status and demonstrates the ability to make significant improvements in function in a reasonable and predictable amount of time.  Equipment Recommendations  BSC/3in1    Recommendations for Other Services       Precautions /  Restrictions Precautions Precautions: Knee;Fall Restrictions Weight Bearing Restrictions: Yes RLE Weight Bearing: Weight bearing as tolerated      Mobility Bed Mobility               General bed mobility comments: NTpt in recliner pre/post session    Transfers Overall transfer level: Needs assistance Equipment used: Rolling walker (2 wheels) Transfers: Sit to/from Stand Sit to Stand: Supervision                  Balance Overall balance assessment: Needs assistance Sitting-balance support: Feet unsupported, Bilateral upper extremity supported Sitting balance-Leahy Scale: Good     Standing balance support: Bilateral upper extremity supported, During functional activity Standing balance-Leahy Scale: Fair                             ADL either performed or assessed with clinical judgement   ADL Overall ADL's : Needs assistance/impaired Eating/Feeding: Set up;Sitting   Grooming: Wash/dry hands;Standing;Supervision/safety Grooming Details (indicate cue type and reason): sink level with RW             Lower Body Dressing: Moderate assistance Lower Body Dressing Details (indicate cue type and reason): socks, anticipate Toilet Transfer: Rolling walker (2 wheels);BSC/3in1;Supervision/safety;Ambulation   Toileting- Clothing Manipulation and Hygiene: Supervision/safety;Sit to/from stand       Functional mobility during ADLs: Supervision/safety;Rolling walker (2 wheels) (household distances)       Vision Patient Visual Report: No change from baseline       Perception     Praxis      Pertinent Vitals/Pain Pain Assessment Pain Assessment: 0-10 Pain Score: 6  Pain Location: R knee Pain Descriptors / Indicators:  Aching, Discomfort, Grimacing Pain Intervention(s): Limited activity within patient's tolerance, Monitored during session, Repositioned, Ice applied     Hand Dominance     Extremity/Trunk Assessment Upper Extremity  Assessment Upper Extremity Assessment: Overall WFL for tasks assessed   Lower Extremity Assessment Lower Extremity Assessment: RLE deficits/detail RLE Deficits / Details: s/p R TKA       Communication Communication Communication: No difficulties   Cognition Arousal/Alertness: Awake/alert Behavior During Therapy: WFL for tasks assessed/performed Overall Cognitive Status: Within Functional Limits for tasks assessed                                       General Comments       Exercises     Shoulder Instructions      Home Living Family/patient expects to be discharged to:: Private residence Living Arrangements: Alone;Children (discharging home with son) Available Help at Discharge: Available 24 hours/day;Family Type of Home: House Home Access: Level entry     Home Layout: Two level Alternate Level Stairs-Number of Steps: 15 Alternate Level Stairs-Rails: Right Bathroom Shower/Tub: Tub/shower unit;Walk-in shower   Bathroom Toilet: Standard Bathroom Accessibility: No   Home Equipment: None          Prior Functioning/Environment Prior Level of Function : Independent/Modified Independent                        OT Problem List: Decreased activity tolerance;Decreased knowledge of use of DME or AE      OT Treatment/Interventions: Self-care/ADL training;Patient/family education;Therapeutic exercise;Balance training;DME and/or AE instruction;Therapeutic activities    OT Goals(Current goals can be found in the care plan section) Acute Rehab OT Goals Patient Stated Goal: get stronger OT Goal Formulation: With patient/family Time For Goal Achievement: 12/18/21 Potential to Achieve Goals: Good ADL Goals Pt Will Perform Grooming: with modified independence;standing Pt Will Perform Lower Body Dressing: sit to/from stand;with modified independence Pt Will Transfer to Toilet: with modified independence;ambulating Pt Will Perform Toileting - Clothing  Manipulation and hygiene: with modified independence;sit to/from stand  OT Frequency: Min 2X/week    Co-evaluation              AM-PAC OT "6 Clicks" Daily Activity     Outcome Measure Help from another person eating meals?: None Help from another person taking care of personal grooming?: None Help from another person toileting, which includes using toliet, bedpan, or urinal?: None Help from another person bathing (including washing, rinsing, drying)?: A Little Help from another person to put on and taking off regular upper body clothing?: None Help from another person to put on and taking off regular lower body clothing?: A Lot 6 Click Score: 21   End of Session Equipment Utilized During Treatment: Gait belt;Rolling walker (2 wheels) Nurse Communication: Mobility status  Activity Tolerance: Patient tolerated treatment well Patient left: in chair;with call bell/phone within reach;with family/visitor present  OT Visit Diagnosis: Unsteadiness on feet (R26.81);Other abnormalities of gait and mobility (R26.89)                Time: 1610-9604 OT Time Calculation (min): 21 min Charges:  OT General Charges $OT Visit: 1 Visit OT Evaluation $OT Eval Low Complexity: 1 Low  Shanon Payor, OTD OTR/L  12/04/21, 10:42 AM

## 2021-12-04 NOTE — Progress Notes (Signed)
Physical Therapy Treatment Patient Details Name: Kathy Burnett MRN: [id number] DOB: 1965-07-11 Today's Date: 12/04/2021   History of Present Illness Pt is 62 YOF admitted for R TKA. PMH includes OA, obesity, HTN, DM2, CKD2.    PT Comments    Pt presents to PT in recliner and agreeable to participate in therapy services.  Pt was pleasant and motivated to participate during the session and put forth good effort throughout. Pt able to amb well, w no physical assistance required and no LOB. Some cueing needed for foot placement during transfers. Demonstrated proper technique for stair negotiation w no physical assistance required and no LOB. Would benefit from skilled HHPT to address above deficits in strength, ROM, functional mobility, gait mechanics, balance, and activity tolerance to promote optimal return to PLOF.   Recommendations for follow up therapy are one component of a multi-disciplinary discharge planning process, led by the attending physician.  Recommendations may be updated based on patient status, additional functional criteria and insurance authorization.  Follow Up Recommendations  Home health PT     Assistance Recommended at Discharge Intermittent Supervision/Assistance  Patient can return home with the following A little help with walking and/or transfers;A little help with bathing/dressing/bathroom;Assistance with cooking/housework;Assist for transportation;Help with stairs or ramp for entrance   Equipment Recommendations  Rolling walker (2 wheels);BSC/3in1    Recommendations for Other Services       Precautions / Restrictions Precautions Precautions: Knee;Fall Precaution Booklet Issued: Yes (comment) Restrictions Weight Bearing Restrictions: Yes RLE Weight Bearing: Weight bearing as tolerated     Mobility  Bed Mobility               General bed mobility comments: NTpt in recliner pre/post session    Transfers Overall transfer level: Needs  assistance Equipment used: Rolling walker (2 wheels) Transfers: Sit to/from Stand Sit to Stand: Supervision                Ambulation/Gait Ambulation/Gait assistance: Min guard Gait Distance (Feet): 200 Feet, 2 seated rest breaks for stair negotiation/car transfer edu Assistive device: Rolling walker (2 wheels) Gait Pattern/deviations: Antalgic, Step-to pattern, Decreased stance time - right, Decreased step length - left, Decreased weight shift to right, Step-through pattern Gait velocity: decr     General Gait Details: incr time and effort   Stairs Stairs: Yes Stairs assistance: Min guard Stair Management: One rail Right, Step to pattern, Sideways, Backwards, With walker Number of Stairs: 4 General stair comments: required no physical assistance, no LOB, up w good, down w surgical pattern   Wheelchair Mobility    Modified Rankin (Stroke Patients Only)       Balance Overall balance assessment: Needs assistance Sitting-balance support: Feet unsupported, Bilateral upper extremity supported Sitting balance-Leahy Scale: Good     Standing balance support: Bilateral upper extremity supported, During functional activity Standing balance-Leahy Scale: Fair                              Cognition Arousal/Alertness: Awake/alert Behavior During Therapy: WFL for tasks assessed/performed Overall Cognitive Status: Within Functional Limits for tasks assessed                                          Exercises Total Joint Exercises Quad Sets: Strengthening, Both, 10 reps, Supine Long Arc Quad: Strengthening, Both, 10 reps Knee Flexion: AROM,  Right, 10 reps Goniometric ROM: 3-85 R knee AROM Other Exercises Other Exercises: car transfer edu    General Comments        Pertinent Vitals/Pain Pain Assessment Pain Assessment: 0-10 Pain Score: 6  Pain Intervention(s): Monitored during session, Limited activity within patient's tolerance,  Premedicated before session, Repositioned    Home Living Family/patient expects to be discharged to:: Private residence Living Arrangements: Alone;Children (d/c home w son) Available Help at Discharge: Available 24 hours/day;Family Type of Home: House Home Access: Level entry     Alternate Level Stairs-Number of Steps: 15 Home Layout: Two level Home Equipment: None      Prior Function            PT Goals (current goals can now be found in the care plan section) Acute Rehab PT Goals Patient Stated Goal: to walk without pain PT Goal Formulation: With patient Time For Goal Achievement: 12/16/21 Potential to Achieve Goals: Good Progress towards PT goals: Progressing toward goals    Frequency    BID      PT Plan Current plan remains appropriate    Co-evaluation              AM-PAC PT "6 Clicks" Mobility   Outcome Measure  Help needed turning from your back to your side while in a flat bed without using bedrails?: None Help needed moving from lying on your back to sitting on the side of a flat bed without using bedrails?: None Help needed moving to and from a bed to a chair (including a wheelchair)?: A Little Help needed standing up from a chair using your arms (e.g., wheelchair or bedside chair)?: A Little Help needed to walk in hospital room?: A Little Help needed climbing 3-5 steps with a railing? : A Little 6 Click Score: 20    End of Session   Activity Tolerance: Patient tolerated treatment well Patient left: in chair;with call bell/phone within reach;with SCD's reapplied;with chair alarm set Nurse Communication: Mobility status PT Visit Diagnosis: Muscle weakness (generalized) (M62.81);Other abnormalities of gait and mobility (R26.89);Pain Pain - Right/Left: Right Pain - part of body: Knee     Time: 0920-1014 PT Time Calculation (min) (ACUTE ONLY): 54 min  Charges:                       Odette Horns MPH, SPT 12/04/21, 10:54 AM

## 2021-12-04 NOTE — Plan of Care (Signed)
  Problem: Education: Goal: Knowledge of General Education information will improve Description: Including pain rating scale, medication(s)/side effects and non-pharmacologic comfort measures Outcome: Progressing   Problem: Activity: Goal: Risk for activity intolerance will decrease Outcome: Progressing   Problem: Nutrition: Goal: Adequate nutrition will be maintained Outcome: Progressing   Problem: Coping: Goal: Level of anxiety will decrease Outcome: Progressing   Problem: Elimination: Goal: Will not experience complications related to bowel motility Outcome: Progressing   Problem: Elimination: Goal: Will not experience complications related to bowel motility Outcome: Progressing Goal: Will not experience complications related to urinary retention Outcome: Progressing   Problem: Pain Managment: Goal: General experience of comfort will improve Outcome: Progressing

## 2022-02-15 ENCOUNTER — Other Ambulatory Visit: Payer: Self-pay | Admitting: Obstetrics and Gynecology

## 2022-02-15 DIAGNOSIS — I10 Essential (primary) hypertension: Secondary | ICD-10-CM

## 2022-02-15 DIAGNOSIS — E7800 Pure hypercholesterolemia, unspecified: Secondary | ICD-10-CM

## 2022-02-15 DIAGNOSIS — R7303 Prediabetes: Secondary | ICD-10-CM

## 2022-02-15 DIAGNOSIS — F419 Anxiety disorder, unspecified: Secondary | ICD-10-CM

## 2022-02-16 ENCOUNTER — Other Ambulatory Visit: Payer: BC Managed Care – PPO

## 2022-02-16 DIAGNOSIS — I10 Essential (primary) hypertension: Secondary | ICD-10-CM

## 2022-02-16 DIAGNOSIS — R7303 Prediabetes: Secondary | ICD-10-CM

## 2022-02-16 DIAGNOSIS — E7800 Pure hypercholesterolemia, unspecified: Secondary | ICD-10-CM

## 2022-02-17 ENCOUNTER — Other Ambulatory Visit: Payer: Self-pay | Admitting: Obstetrics and Gynecology

## 2022-02-17 DIAGNOSIS — F419 Anxiety disorder, unspecified: Secondary | ICD-10-CM

## 2022-02-17 DIAGNOSIS — E7800 Pure hypercholesterolemia, unspecified: Secondary | ICD-10-CM

## 2022-02-17 DIAGNOSIS — I10 Essential (primary) hypertension: Secondary | ICD-10-CM

## 2022-02-17 LAB — COMPREHENSIVE METABOLIC PANEL
ALT: 15 IU/L (ref 0–32)
AST: 18 IU/L (ref 0–40)
Albumin/Globulin Ratio: 1.7 (ref 1.2–2.2)
Albumin: 4.4 g/dL (ref 3.8–4.9)
Alkaline Phosphatase: 110 IU/L (ref 44–121)
BUN/Creatinine Ratio: 13 (ref 9–23)
BUN: 11 mg/dL (ref 6–24)
Bilirubin Total: 0.3 mg/dL (ref 0.0–1.2)
CO2: 23 mmol/L (ref 20–29)
Calcium: 9.8 mg/dL (ref 8.7–10.2)
Chloride: 103 mmol/L (ref 96–106)
Creatinine, Ser: 0.87 mg/dL (ref 0.57–1.00)
Globulin, Total: 2.6 g/dL (ref 1.5–4.5)
Glucose: 123 mg/dL — ABNORMAL HIGH (ref 70–99)
Potassium: 4.8 mmol/L (ref 3.5–5.2)
Sodium: 140 mmol/L (ref 134–144)
Total Protein: 7 g/dL (ref 6.0–8.5)
eGFR: 77 mL/min/{1.73_m2} (ref >59)

## 2022-02-17 LAB — LIPID PANEL
Chol/HDL Ratio: 3.1 ratio (ref 0.0–4.4)
Cholesterol, Total: 159 mg/dL (ref 100–199)
HDL: 52 mg/dL (ref >39)
LDL Chol Calc (NIH): 84 mg/dL (ref 0–99)
Triglycerides: 133 mg/dL (ref 0–149)
VLDL Cholesterol Cal: 23 mg/dL (ref 5–40)

## 2022-02-17 LAB — HEMOGLOBIN A1C
Est. average glucose Bld gHb Est-mCnc: 128 mg/dL
Hgb A1c MFr Bld: 6.1 % — ABNORMAL HIGH (ref 4.8–5.6)

## 2022-02-17 MED ORDER — ATORVASTATIN CALCIUM 20 MG PO TABS: ORAL_TABLET | ORAL | 0 refills | Status: DC

## 2022-02-17 MED ORDER — LISINOPRIL 10 MG PO TABS: 10.0000 mg | ORAL_TABLET | Freq: Every day | ORAL | 0 refills | Status: DC

## 2022-02-17 MED ORDER — CITALOPRAM HYDROBROMIDE 40 MG PO TABS: ORAL_TABLET | ORAL | 0 refills | Status: DC

## 2022-03-23 ENCOUNTER — Ambulatory Visit: Payer: BC Managed Care – PPO | Admitting: Obstetrics and Gynecology

## 2022-04-13 NOTE — Progress Notes (Unsigned)
PCP: Chad Cordial, PA-C   No chief complaint on file.    HPI:      [name] is a 57 y.o. [id number] who LMP was No LMP recorded. Patient is postmenopausal., presents today for her annual examination.  Her menses are absent due to menopause.  She does not have PMB. She does not have vasomotor sx.   Sex activity: not sex active. She does not have vaginal dryness.  Last Pap: 07/11/17  Results were: no abnormalities /neg HPV DNA.  Hx of STDs: none  Last mammogram: 01/23/21  Results were: normal--routine follow-up in 12 months Pt is adopted, FH unknown. The patient does do self-breast exams.   Colonoscopy: colonoscopy 2015 without abnormalities.  Repeat due after 10 years.   Tobacco use: The patient denies current or previous tobacco use. Alcohol use: none  No drug use Exercise: moderately active  She does get adequate calcium but not Vitamin D in her diet. Not taking Vit D supp.  Pt takes celexa 40 mg daily for anxiety with sx control. No side effects. Wants to cont meds.   Pt with hx of hyperlipidemia, type 2 DM, stage 2 kidney disease, and HTN and is on lipitor 20 mg and lisinopril 10 mg. Lost 70# 3 yrs ago with keto diet/exercise changes and DM resolved, but has gained some wt back past 2 yrs. Had pre-DM on labs last yr, as well as normal lipids with lipitor and no evidence of kidney disease on lisinopril. Labs due today. Increasing HgA1C 12/23 Plans to restart keto for wt loss  Past Medical History:  Diagnosis Date   Anxiety    Diabetes mellitus without complication ([place])    Hypercholesteremia    Hypertension    Vitamin D deficiency     Past Surgical History:  Procedure Laterality Date   BREAST BIOPSY Right 2012   2 bx/clips-neg   CESAREAN SECTION  1992   COLONOSCOPY  2015   TOTAL KNEE ARTHROPLASTY Right 12/03/2021   Procedure: TOTAL KNEE ARTHROPLASTY;  Surgeon: Corky Mull, MD;  Location: [facility] ORS;  Service: Orthopedics;  Laterality: Right;     Family History  Adopted: Yes    Social History   Socioeconomic History   Marital status: Widowed    Spouse name: Not on file   Number of children: 2   Years of education: Not on file   Highest education level: Not on file  Occupational History   Not on file  Tobacco Use   Smoking status: Never   Smokeless tobacco: Never  Vaping Use   Vaping Use: Never used  Substance and Sexual Activity   Alcohol use: Yes    Comment: occassional   Drug use: No   Sexual activity: Not Currently    Birth control/protection: Post-menopausal  Other Topics Concern   Not on file  Social History Narrative   Lives alone   Social Determinants of Health   Financial Resource Strain: Not on file  Food Insecurity: Not on file  Transportation Needs: Not on file  Physical Activity: Not on file  Stress: Not on file  Social Connections: Not on file  Intimate Partner Violence: Not on file    Outpatient Medications Prior to Visit  Medication Sig Dispense Refill   acetaminophen (TYLENOL) 500 MG tablet Take 1-2 tablets (500-1,000 mg total) by mouth every 6 (six) hours as needed. 60 tablet 0   apixaban (ELIQUIS) 2.5 MG TABS tablet Take 1 tablet (2.5 mg total) by mouth  2 (two) times daily. 30 tablet 0   atorvastatin (LIPITOR) 20 MG tablet TAKE 1 TABLET(20 MG) BY MOUTH DAILY 90 tablet 0   celecoxib (CELEBREX) 100 MG capsule Take 1 capsule (100 mg total) by mouth 2 (two) times daily. 60 capsule 0   citalopram (CELEXA) 40 MG tablet TAKE 1 TABLET(40 MG) BY MOUTH DAILY 90 tablet 0   lisinopril (ZESTRIL) 10 MG tablet Take 1 tablet (10 mg total) by mouth daily. 90 tablet 0   magnesium oxide (MAG-OX) 400 (240 Mg) MG tablet Take 400 mg by mouth daily.     ondansetron (ZOFRAN) 4 MG tablet Take 1 tablet (4 mg total) by mouth every 6 (six) hours as needed for nausea. 30 tablet 0   oxyCODONE (OXY IR/ROXICODONE) 5 MG immediate release tablet Take 1-2 tablets (5-10 mg total) by mouth every 4 (four) hours as needed  for moderate pain (pain score 4-6). 40 tablet 0   promethazine (PHENERGAN) 12.5 MG tablet Take by mouth.     No facility-administered medications prior to visit.       ROS:  Review of Systems  Constitutional:  Negative for fatigue, fever and unexpected weight change.  Respiratory:  Negative for cough, shortness of breath and wheezing.   Cardiovascular:  Negative for chest pain, palpitations and leg swelling.  Gastrointestinal:  Negative for blood in stool, constipation, diarrhea, nausea and vomiting.  Endocrine: Negative for cold intolerance, heat intolerance and polyuria.  Genitourinary:  Negative for dyspareunia, dysuria, flank pain, frequency, genital sores, hematuria, menstrual problem, pelvic pain, urgency, vaginal bleeding, vaginal discharge and vaginal pain.  Musculoskeletal:  Negative for back pain, joint swelling and myalgias.  Skin:  Negative for rash.  Neurological:  Negative for dizziness, syncope, light-headedness, numbness and headaches.  Hematological:  Negative for adenopathy.  Psychiatric/Behavioral:  Negative for agitation, confusion, sleep disturbance and suicidal ideas. The patient is not nervous/anxious.   BREAST: No symptoms    Objective: There were no vitals taken for this visit.   Physical Exam Constitutional:      Appearance: She is well-developed.  Genitourinary:     Vulva normal.     Right Labia: No rash, tenderness or lesions.    Left Labia: No tenderness, lesions or rash.    No vaginal discharge, erythema or tenderness.      Right Adnexa: not tender and no mass present.    Left Adnexa: not tender and no mass present.    No cervical motion tenderness, friability or polyp.     Uterus is not enlarged or tender.  Breasts:    Right: No mass, nipple discharge, skin change or tenderness.     Left: No mass, nipple discharge, skin change or tenderness.  Neck:     Thyroid: No thyromegaly.  Cardiovascular:     Rate and Rhythm: Normal rate and  regular rhythm.     Heart sounds: Normal heart sounds. No murmur heard. Pulmonary:     Effort: Pulmonary effort is normal.     Breath sounds: Normal breath sounds.  Abdominal:     Palpations: Abdomen is soft.     Tenderness: There is no abdominal tenderness. There is no guarding or rebound.  Musculoskeletal:        General: Normal range of motion.     Cervical back: Normal range of motion.  Lymphadenopathy:     Cervical: No cervical adenopathy.  Neurological:     General: No focal deficit present.     Mental Status: She is alert and  oriented to person, place, and time.     Cranial Nerves: No cranial nerve deficit.  Skin:    General: Skin is warm and dry.  Psychiatric:        Mood and Affect: Mood normal.        Behavior: Behavior normal.        Thought Content: Thought content normal.        Judgment: Judgment normal.  Vitals reviewed.     Assessment/Plan: Encounter for annual routine gynecological examination  Encounter for screening mammogram for malignant neoplasm of breast - Plan: MM 3D SCREEN BREAST BILATERAL; pt to sched mammo  Anxiety - Plan: citalopram (CELEXA) 40 MG tablet; Rx RF  Blood tests for routine general physical examination - Plan: Comprehensive metabolic panel, Lipid panel, Hemoglobin A1c  Essential hypertension - Plan: Comprehensive metabolic panel; BP good. Cont lisinopril, Rx RF.  Pre-diabetes - Plan: Hemoglobin A1c  Mixed hyperlipidemia - Plan: Lipid panel; check labs. Will RF meds based on results.   Screening cholesterol level - Plan: Lipid panel  Screening for diabetes mellitus - Plan: Hemoglobin A1c; resume diet/wt loss for labs   No orders of the defined types were placed in this encounter.           GYN counsel breast self exam, mammography screening, menopause, adequate intake of calcium and vitamin D, diet and exercise    F/U  No follow-ups on file.  Dearis Danis B. Caci Orren, PA-C 04/13/2022 1:19 PM

## 2022-04-15 ENCOUNTER — Other Ambulatory Visit (HOSPITAL_COMMUNITY)
Admission: RE | Admit: 2022-04-15 | Discharge: 2022-04-15 | Disposition: A | Discharge status: Home / Self Care | Payer: BC Managed Care – PPO | Source: Ambulatory Visit | Attending: Obstetrics and Gynecology | Admitting: Obstetrics and Gynecology

## 2022-04-15 ENCOUNTER — Ambulatory Visit (INDEPENDENT_AMBULATORY_CARE_PROVIDER_SITE_OTHER): Payer: BC Managed Care – PPO | Admitting: Obstetrics and Gynecology

## 2022-04-15 ENCOUNTER — Encounter: Payer: Self-pay | Admitting: Obstetrics and Gynecology

## 2022-04-15 VITALS — BP 120/84 | Ht 64.0 in | Wt 210.0 lb

## 2022-04-15 DIAGNOSIS — E782 Mixed hyperlipidemia: Secondary | ICD-10-CM

## 2022-04-15 DIAGNOSIS — Z124 Encounter for screening for malignant neoplasm of cervix: Secondary | ICD-10-CM | POA: Insufficient documentation

## 2022-04-15 DIAGNOSIS — Z01419 Encounter for gynecological examination (general) (routine) without abnormal findings: Secondary | ICD-10-CM

## 2022-04-15 DIAGNOSIS — R7303 Prediabetes: Secondary | ICD-10-CM

## 2022-04-15 DIAGNOSIS — Z1151 Encounter for screening for human papillomavirus (HPV): Secondary | ICD-10-CM | POA: Insufficient documentation

## 2022-04-15 DIAGNOSIS — I10 Essential (primary) hypertension: Secondary | ICD-10-CM

## 2022-04-15 DIAGNOSIS — Z1231 Encounter for screening mammogram for malignant neoplasm of breast: Secondary | ICD-10-CM

## 2022-04-15 DIAGNOSIS — E7800 Pure hypercholesterolemia, unspecified: Secondary | ICD-10-CM

## 2022-04-15 DIAGNOSIS — F419 Anxiety disorder, unspecified: Secondary | ICD-10-CM

## 2022-04-15 MED ORDER — ATORVASTATIN CALCIUM 20 MG PO TABS: ORAL_TABLET | ORAL | 3 refills | Status: DC

## 2022-04-15 MED ORDER — CITALOPRAM HYDROBROMIDE 40 MG PO TABS: ORAL_TABLET | ORAL | 3 refills | Status: DC

## 2022-04-15 MED ORDER — LISINOPRIL 10 MG PO TABS: 10.0000 mg | ORAL_TABLET | Freq: Every day | ORAL | 3 refills | Status: DC

## 2022-04-15 NOTE — Patient Instructions (Addendum)
I value your feedback and you entrusting us with your care. If you get a [place] patient survey, I would appreciate you taking the time to let us know about your experience today. Thank you!  [facility]: [phone]      

## 2022-04-19 LAB — CYTOLOGY - PAP
Comment: NEGATIVE
Diagnosis: NEGATIVE
High risk HPV: NEGATIVE

## 2022-05-06 ENCOUNTER — Ambulatory Visit
Admission: RE | Admit: 2022-05-06 | Discharge: 2022-05-06 | Disposition: A | Discharge status: Home / Self Care | Payer: BC Managed Care – PPO | Source: Ambulatory Visit | Attending: Obstetrics and Gynecology | Admitting: Obstetrics and Gynecology

## 2022-05-06 DIAGNOSIS — Z1231 Encounter for screening mammogram for malignant neoplasm of breast: Secondary | ICD-10-CM | POA: Diagnosis not present

## 2023-03-28 ENCOUNTER — Other Ambulatory Visit: Payer: Self-pay | Admitting: Obstetrics and Gynecology

## 2023-03-28 DIAGNOSIS — F419 Anxiety disorder, unspecified: Secondary | ICD-10-CM

## 2023-03-28 DIAGNOSIS — I10 Essential (primary) hypertension: Secondary | ICD-10-CM

## 2023-03-28 DIAGNOSIS — E7800 Pure hypercholesterolemia, unspecified: Secondary | ICD-10-CM

## 2023-03-28 MED ORDER — CITALOPRAM HYDROBROMIDE 40 MG PO TABS: ORAL_TABLET | ORAL | 0 refills | Status: AC

## 2023-03-28 MED ORDER — LISINOPRIL 10 MG PO TABS: 10.0000 mg | ORAL_TABLET | Freq: Every day | ORAL | 0 refills | Status: AC

## 2023-03-28 MED ORDER — ATORVASTATIN CALCIUM 20 MG PO TABS: ORAL_TABLET | ORAL | 0 refills | Status: AC

## 2023-03-28 NOTE — Progress Notes (Signed)
 Rx RF to [facility] where pt moved. Finding new PCP

## 2023-05-11 ENCOUNTER — Other Ambulatory Visit: Payer: Self-pay | Admitting: Obstetrics and Gynecology

## 2023-05-11 DIAGNOSIS — E7800 Pure hypercholesterolemia, unspecified: Secondary | ICD-10-CM

## 2023-05-11 DIAGNOSIS — F419 Anxiety disorder, unspecified: Secondary | ICD-10-CM
